# Patient Record
Sex: Male | Born: 1970 | Race: White | Hispanic: No | Marital: Married | Smoking: Current every day smoker
Health system: Southern US, Community
[De-identification: ages and names within clinical notes are randomized; demographics above are authoritative.]

## PROBLEM LIST (undated history)

## (undated) DIAGNOSIS — E079 Disorder of thyroid, unspecified: Secondary | ICD-10-CM

## (undated) DIAGNOSIS — I10 Essential (primary) hypertension: Secondary | ICD-10-CM

---

## 2013-09-27 ENCOUNTER — Inpatient Hospital Stay (HOSPITAL_COMMUNITY)
Admission: EM | Admit: 2013-09-27 | Discharge: 2013-09-30 | DRG: 202 | Disposition: A | Discharge status: Home / Self Care | Payer: PRIVATE HEALTH INSURANCE | Attending: Internal Medicine | Admitting: Internal Medicine

## 2013-09-27 ENCOUNTER — Observation Stay (HOSPITAL_COMMUNITY): Payer: PRIVATE HEALTH INSURANCE

## 2013-09-27 ENCOUNTER — Emergency Department (HOSPITAL_COMMUNITY): Payer: PRIVATE HEALTH INSURANCE

## 2013-09-27 ENCOUNTER — Encounter (HOSPITAL_COMMUNITY): Payer: Self-pay | Admitting: Emergency Medicine

## 2013-09-27 DIAGNOSIS — F17200 Nicotine dependence, unspecified, uncomplicated: Secondary | ICD-10-CM | POA: Diagnosis present

## 2013-09-27 DIAGNOSIS — E785 Hyperlipidemia, unspecified: Secondary | ICD-10-CM

## 2013-09-27 DIAGNOSIS — G4733 Obstructive sleep apnea (adult) (pediatric): Secondary | ICD-10-CM | POA: Diagnosis present

## 2013-09-27 DIAGNOSIS — I509 Heart failure, unspecified: Secondary | ICD-10-CM | POA: Diagnosis present

## 2013-09-27 DIAGNOSIS — R5381 Other malaise: Secondary | ICD-10-CM | POA: Diagnosis present

## 2013-09-27 DIAGNOSIS — R062 Wheezing: Secondary | ICD-10-CM

## 2013-09-27 DIAGNOSIS — Z6841 Body Mass Index (BMI) 40.0 and over, adult: Secondary | ICD-10-CM

## 2013-09-27 DIAGNOSIS — E669 Obesity, unspecified: Secondary | ICD-10-CM | POA: Diagnosis present

## 2013-09-27 DIAGNOSIS — R079 Chest pain, unspecified: Secondary | ICD-10-CM

## 2013-09-27 DIAGNOSIS — E039 Hypothyroidism, unspecified: Secondary | ICD-10-CM

## 2013-09-27 DIAGNOSIS — J209 Acute bronchitis, unspecified: Principal | ICD-10-CM

## 2013-09-27 DIAGNOSIS — I5033 Acute on chronic diastolic (congestive) heart failure: Secondary | ICD-10-CM

## 2013-09-27 DIAGNOSIS — E069 Thyroiditis, unspecified: Secondary | ICD-10-CM | POA: Diagnosis present

## 2013-09-27 DIAGNOSIS — I10 Essential (primary) hypertension: Secondary | ICD-10-CM

## 2013-09-27 HISTORY — DX: Disorder of thyroid, unspecified: E07.9

## 2013-09-27 HISTORY — DX: Essential (primary) hypertension: I10

## 2013-09-27 LAB — CBC
HCT: 43.8 % (ref 39.0–52.0)
HEMATOCRIT: 45 % (ref 39.0–52.0)
HEMOGLOBIN: 16.1 g/dL (ref 13.0–17.0)
Hemoglobin: 15.4 g/dL (ref 13.0–17.0)
MCH: 33.6 pg (ref 26.0–34.0)
MCH: 33.5 pg (ref 26.0–34.0)
MCHC: 35.8 g/dL (ref 30.0–36.0)
MCHC: 35.2 g/dL (ref 30.0–36.0)
MCV: 93.9 fL (ref 78.0–100.0)
MCV: 95.2 fL (ref 78.0–100.0)
PLATELETS: 293 10*3/uL (ref 150–400)
Platelets: 301 10*3/uL (ref 150–400)
RBC: 4.79 MIL/uL (ref 4.22–5.81)
RBC: 4.6 MIL/uL (ref 4.22–5.81)
RDW: 13.3 % (ref 11.5–15.5)
RDW: 13.5 % (ref 11.5–15.5)
WBC: 16.5 10*3/uL — ABNORMAL HIGH (ref 4.0–10.5)
WBC: 14.4 10*3/uL — AB (ref 4.0–10.5)

## 2013-09-27 LAB — BASIC METABOLIC PANEL
BUN: 11 mg/dL (ref 6–23)
CO2: 24 mEq/L (ref 19–32)
Calcium: 9.6 mg/dL (ref 8.4–10.5)
Chloride: 98 mEq/L (ref 96–112)
Creatinine, Ser: 0.99 mg/dL (ref 0.50–1.35)
GFR calc Af Amer: >90 mL/min (ref >90)
GLUCOSE: 198 mg/dL — AB (ref 70–99)
Potassium: 4 mEq/L (ref 3.7–5.3)
Sodium: 139 mEq/L (ref 137–147)

## 2013-09-27 LAB — LIPID PANEL
CHOL/HDL RATIO: 9.2 ratio
CHOLESTEROL: 302 mg/dL — AB (ref 0–200)
HDL: 33 mg/dL — ABNORMAL LOW (ref >39)
LDL Cholesterol: UNDETERMINED mg/dL (ref 0–99)
Triglycerides: 443 mg/dL — ABNORMAL HIGH (ref <150)
VLDL: UNDETERMINED mg/dL (ref 0–40)

## 2013-09-27 LAB — CREATININE, SERUM
Creatinine, Ser: 1.1 mg/dL (ref 0.50–1.35)
GFR calc non Af Amer: 80 mL/min — ABNORMAL LOW (ref >90)

## 2013-09-27 LAB — I-STAT TROPONIN, ED: Troponin i, poc: 0 ng/mL (ref 0.00–0.08)

## 2013-09-27 LAB — TROPONIN I

## 2013-09-27 LAB — PRO B NATRIURETIC PEPTIDE: PRO B NATRI PEPTIDE: 17.4 pg/mL (ref 0–125)

## 2013-09-27 MED ORDER — ACETAMINOPHEN 500 MG PO TABS
1000.0000 mg | ORAL_TABLET | Freq: Once | ORAL | Status: AC
  Administered 2013-09-27: 1000 mg via ORAL
  Filled 2013-09-27: qty 2

## 2013-09-27 MED ORDER — ALBUTEROL SULFATE (2.5 MG/3ML) 0.083% IN NEBU: 2.5000 mg | INHALATION_SOLUTION | RESPIRATORY_TRACT | Status: DC | PRN

## 2013-09-27 MED ORDER — AMLODIPINE BESYLATE 5 MG PO TABS
5.0000 mg | ORAL_TABLET | Freq: Every day | ORAL | Status: DC
  Administered 2013-09-27 – 2013-09-30 (×4): 5 mg via ORAL
  Filled 2013-09-27 (×4): qty 1

## 2013-09-27 MED ORDER — SENNA 8.6 MG PO TABS
1.0000 | ORAL_TABLET | Freq: Two times a day (BID) | ORAL | Status: DC
  Administered 2013-09-27 – 2013-09-29 (×5): 8.6 mg via ORAL
  Filled 2013-09-27 (×7): qty 1

## 2013-09-27 MED ORDER — OXYCODONE HCL 5 MG PO TABS
5.0000 mg | ORAL_TABLET | ORAL | Status: DC | PRN
  Administered 2013-09-28 (×2): 5 mg via ORAL
  Filled 2013-09-27 (×2): qty 1

## 2013-09-27 MED ORDER — LEVOTHYROXINE SODIUM 25 MCG PO TABS
25.0000 ug | ORAL_TABLET | Freq: Every day | ORAL | Status: DC
  Administered 2013-09-28: 25 ug via ORAL
  Filled 2013-09-27 (×2): qty 1

## 2013-09-27 MED ORDER — MORPHINE SULFATE 4 MG/ML IJ SOLN
4.0000 mg | Freq: Once | INTRAMUSCULAR | Status: AC
Start: 2013-09-27 — End: 2013-09-27
  Administered 2013-09-27: 4 mg via INTRAVENOUS
  Filled 2013-09-27: qty 1

## 2013-09-27 MED ORDER — ONDANSETRON HCL 4 MG/2ML IJ SOLN: 4.0000 mg | Freq: Four times a day (QID) | INTRAMUSCULAR | Status: DC | PRN

## 2013-09-27 MED ORDER — SODIUM CHLORIDE 0.9 % IJ SOLN
3.0000 mL | Freq: Two times a day (BID) | INTRAMUSCULAR | Status: DC
  Administered 2013-09-27 – 2013-09-29 (×3): 3 mL via INTRAVENOUS

## 2013-09-27 MED ORDER — ONDANSETRON HCL 4 MG/2ML IJ SOLN
4.0000 mg | Freq: Once | INTRAMUSCULAR | Status: AC
  Administered 2013-09-27: 4 mg via INTRAVENOUS
  Filled 2013-09-27: qty 2

## 2013-09-27 MED ORDER — SODIUM CHLORIDE 0.9 % IV SOLN
INTRAVENOUS | Status: DC
  Administered 2013-09-27 – 2013-09-28 (×2): via INTRAVENOUS

## 2013-09-27 MED ORDER — NICOTINE 21 MG/24HR TD PT24
21.0000 mg | MEDICATED_PATCH | Freq: Every day | TRANSDERMAL | Status: DC
  Administered 2013-09-27 – 2013-09-30 (×4): 21 mg via TRANSDERMAL
  Filled 2013-09-27 (×4): qty 1

## 2013-09-27 MED ORDER — ONDANSETRON HCL 4 MG PO TABS: 4.0000 mg | ORAL_TABLET | Freq: Four times a day (QID) | ORAL | Status: DC | PRN

## 2013-09-27 MED ORDER — SORBITOL 70 % SOLN
30.0000 mL | Freq: Every day | Status: DC | PRN
  Filled 2013-09-27: qty 30

## 2013-09-27 MED ORDER — IOHEXOL 350 MG/ML SOLN
100.0000 mL | Freq: Once | INTRAVENOUS | Status: AC | PRN
  Administered 2013-09-27: 100 mL via INTRAVENOUS

## 2013-09-27 MED ORDER — LABETALOL HCL 5 MG/ML IV SOLN: 10.0000 mg | INTRAVENOUS | Status: DC | PRN

## 2013-09-27 MED ORDER — DOCUSATE SODIUM 100 MG PO CAPS
100.0000 mg | ORAL_CAPSULE | Freq: Two times a day (BID) | ORAL | Status: DC
  Administered 2013-09-27 – 2013-09-29 (×5): 100 mg via ORAL
  Filled 2013-09-27 (×7): qty 1

## 2013-09-27 MED ORDER — HEPARIN SODIUM (PORCINE) 5000 UNIT/ML IJ SOLN
5000.0000 [IU] | Freq: Three times a day (TID) | INTRAMUSCULAR | Status: DC
  Administered 2013-09-27 – 2013-09-30 (×8): 5000 [IU] via SUBCUTANEOUS
  Filled 2013-09-27 (×11): qty 1

## 2013-09-27 MED ORDER — ASPIRIN EC 325 MG PO TBEC
325.0000 mg | DELAYED_RELEASE_TABLET | Freq: Every day | ORAL | Status: DC
  Administered 2013-09-27 – 2013-09-30 (×4): 325 mg via ORAL
  Filled 2013-09-27 (×4): qty 1

## 2013-09-27 NOTE — H&P (Signed)
[facility] History and Physical  Brydon Spahr ZOX:[id number] DOB: 1971/07/03 DOA: 09/27/2013  Referring provider: Irish Elders NP PCP: No PCP Per Patient and patient reports seeing a physician associated with [facility]; does not know the name  Chief Complaint: Chest pain, dizziness, syncope  HPI: Tejay Hubert is a 43 y.o. male. Patient has little known past medical history. 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 He thinks his TSH was 27 although he does not know this test by name. He started levothyroxine 25 mcg. Does have ongoing tobacco abuse one half pack per day. OSA not on positive pressure.  Patient has had upper respiratory symptoms including rhinorrhea, nasal drainage, cough productive of intermittent green sputum. He developed several episodes of sharp pain in the left side of his chest and between his shoulder blades in the back. Episodes last about 15-20 minutes and are not provoked by anything identifiable nor relieved consistently by anything. He estimates that he had 7-8 episodes of pain daily from Saturday through Monday. He does have pain-free intervals between these. Most recently the pain is in the upper back and radiates to certain extent his left arm. It is consistently worsened by deep inspiration so the patient says. Around noon today the patient became dizzy and after a few moments passed out lost consciousness. Was found by his wife who estimated that he was out for 3 or 4 minutes, but she is not sure about the specific duration.   Patient does report fatigue. Denies any fevers or chills. No known sick contacts. Denies bowel or bladder changes. Does report intermittent wheezing which is unusual for him; reports no history of COPD or asthma although he does think he was diagnosed with bronchitis sometime ago. Patient is not aware of any history of cardiovascular disease but his mother  did have coronary disease in her 42s. Patient denies history of hyperlipidemia.   Does report fatigue and weight gain. Did have a period where he felt as if his thyroid gland was tender but this is resolving.  Review of Systems:  Constitutional:  No weight loss, night sweats, Fevers, chills.   + Fatigue HEENT:  No headaches, Difficulty swallowing,Tooth/dental problems,  No , itching, ear ache, ; + sneezing, nasal congestion, post nasal drip,  Cardio-vascular:  No Orthopnea, PND; does have swelling in lower extremities over past several weeks GI:  No heartburn, indigestion, abdominal pain, nausea, vomiting, diarrhea, change in bowel habits, loss of appetite  Resp:  .No coughing up of blood.No change in color of mucus Skin:  no rash or lesions reported GU:  no dysuria, change in color of urine, no urgency or frequency. No flank pain.  Musculoskeletal:  Psych:  No change in mood or affect. No depression or anxiety. No memory loss.  The remainder of a complete review of systems was reviewed and was negative.  Past Medical History  Diagnosis Date  . Thyroid disease    History reviewed. No pertinent past surgical history. Social History:  reports that he has been smoking.  He does not have any smokeless tobacco history on file. He reports that he does not drink alcohol or use illicit drugs.  Allergies  Allergen Reactions  . Sulfa Antibiotics Other (See Comments)    Childhood reaction (throat swelled)    No family history on file.   Prior to Admission medications   Medication Sig Start Date End Date Taking? Authorizing Provider  levothyroxine (SYNTHROID, LEVOTHROID) 25 MCG  tablet Take 25 mcg by mouth daily before breakfast.   Yes Historical Provider, MD    Physical Exam: Filed Vitals:   09/27/13 2009  BP: 136/70  Pulse: 93  Temp: 97.6 F (36.4 C)  Resp: 22    BP 136/70  Pulse 93  Temp(Src) 97.6 F (36.4 C) (Oral)  Resp 22  Ht 6\' 4"  (1.93 m)  Wt 151.91 kg (334 lb  14.4 oz)  BMI 40.78 kg/m2  SpO2 97%  BP 136/70  Pulse 93  Temp(Src) 97.6 F (36.4 C) (Oral)  Resp 22  Ht 6\' 4"  (1.93 m)  Wt 151.91 kg (334 lb 14.4 oz)  BMI 40.78 kg/m2  SpO2 97%  General Appearance:    Alert, cooperative, no distress, appears stated age  Head:    Normocephalic, without obvious abnormality, atraumatic  Eyes:    PERRL, conjunctiva/corneas clear, EOM's intact, fundi    benign, both eyes       Nose:   Nares normal, septum midline, mucosa normal,  clear drainage  Noted but no sinus tenderness  Throat:   Lips, mucosa, and tongue normal; teeth and gums normal  Neck:   Supple, symmetrical, trachea midline, no adenopathy;       Thyroid is symmetrically enlarged without palpable nodules; mobile; nontender  Lungs:     Clear to auscultation bilaterally except for lower pitched end expiratory wheezes in all posterior lung fields, respirations unlabored  Chest wall:    No tenderness or deformity  Heart:    Regular rate and rhythm, S1 and S2 normal, no murmur, rub   or gallop  Abdomen:     Soft, non-tender, bowel sounds active all four quadrants,    no masses, no organomegaly  Extremities:   Extremities normal, atraumatic, no cyanosis or edema  Pulses:   2+ and symmetric all extremities  Skin:   Skin color, texture slightly coarsened, turgor normal, no rashes or lesions  Neurologic:   Good attentiveness and insight into condition            Labs on Admission:  Basic Metabolic Panel:  Recent Labs Lab 09/27/13 1536  NA 139  K 4.0  CL 98  CO2 24  GLUCOSE 198*  BUN 11  CREATININE 0.99  CALCIUM 9.6   Liver Function Tests: No results found for this basename: AST, ALT, ALKPHOS, BILITOT, PROT, ALBUMIN,  in the last 168 hours No results found for this basename: LIPASE, AMYLASE,  in the last 168 hours No results found for this basename: AMMONIA,  in the last 168 hours CBC:  Recent Labs Lab 09/27/13 1536  WBC 16.5*  HGB 16.1  HCT 45.0  MCV 93.9  PLT 301    Cardiac Enzymes: No results found for this basename: CKTOTAL, CKMB, CKMBINDEX, TROPONINI,  in the last 168 hours  BNP (last 3 results) No results found for this basename: PROBNP,  in the last 8760 hours CBG: No results found for this basename: GLUCAP,  in the last 168 hours  Radiological Exams on Admission: Ct Angio Chest Aortic Dissect W &/or W/o  09/27/2013   CLINICAL DATA:  Chest pain. Left-sided neck and abdominal pain. Syncopal episode.  EXAM: CT ANGIOGRAPHY CHEST, ABDOMEN AND PELVIS  TECHNIQUE: Multidetector CT imaging through the chest, abdomen and pelvis was performed using the standard protocol during bolus administration of intravenous contrast. Multiplanar reconstructed images and MIPs were obtained and reviewed to evaluate the vascular anatomy.  CONTRAST:  OMNIPAQUE IOHEXOL 350 MG/ML SOLN  COMPARISON:  DG  CHEST PORTABLE dated 08/27/2013  FINDINGS: CTA CHEST FINDINGS  Lungs/Pleura: Mild motion degradation. Suspect mild centrilobular emphysema. Subpleural lymph node at 3 mm along the right minor fissure on image 56/series 6. A lingular calcified granuloma on image 59/series 6.  No pleural fluid.  Minimal bilateral pleural thickening.  Heart/Mediastinum: No intramural hematoma on unenhanced images. No supraclavicular adenopathy. Mild soft tissue fullness of the thyroid gland, greater on the left. No well-defined focal lesion.  Normal aortic caliber without dissection. No mediastinal adenopathy. Calcified left hilar 1.5 cm node on image 57/series 5. Mildly prominent right hilar and infrahilar nodal tissue which is likely reactive.  Review of the MIP images confirms the above findings.  CTA ABDOMEN AND PELVIS FINDINGS  Abdomen/Pelvis: Mild hepatic steatosis. Normal spleen, stomach, pancreas, gallbladder, biliary tract, adrenal glands, right kidney. Too small to characterize lower pole left renal lesion on image 189/series 5.  Normal caliber of the abdominal aorta, without evidence of  dissection. There are 2 right renal arteries. The inferior mesenteric artery is patent. No retroperitoneal or retrocrural adenopathy.  Normal colon, appendix, and terminal ileum. Normal small bowel without abdominal ascites. A fat containing left inguinal hernia. No pelvic adenopathy. The iliac vasculature is patent, without dissection or significant stenosis. Normal urinary bladder and prostate. No significant free fluid.  Bones/Musculoskeletal: Scattered pelvic bone islands. Remote left rib fractures.  Review of the MIP images confirms the above findings.  IMPRESSION: 1. No evidence above aortic dissection or aneurysm. 2. No other explanation for chest pain. 3. Old granulomatous disease in the left side of the chest. 4. Mild hepatic steatosis.   Electronically Signed   By: Jeronimo Greaves M.D.   On: 09/27/2013 17:33   Ct Cta Abd/pel W/cm &/or W/o Cm  09/27/2013   CLINICAL DATA:  Chest pain. Left-sided neck and abdominal pain. Syncopal episode.  EXAM: CT ANGIOGRAPHY CHEST, ABDOMEN AND PELVIS  TECHNIQUE: Multidetector CT imaging through the chest, abdomen and pelvis was performed using the standard protocol during bolus administration of intravenous contrast. Multiplanar reconstructed images and MIPs were obtained and reviewed to evaluate the vascular anatomy.  CONTRAST:  OMNIPAQUE IOHEXOL 350 MG/ML SOLN  COMPARISON:  DG CHEST PORTABLE dated 08/27/2013  FINDINGS: CTA CHEST FINDINGS  Lungs/Pleura: Mild motion degradation. Suspect mild centrilobular emphysema. Subpleural lymph node at 3 mm along the right minor fissure on image 56/series 6. A lingular calcified granuloma on image 59/series 6.  No pleural fluid.  Minimal bilateral pleural thickening.  Heart/Mediastinum: No intramural hematoma on unenhanced images. No supraclavicular adenopathy. Mild soft tissue fullness of the thyroid gland, greater on the left. No well-defined focal lesion.  Normal aortic caliber without dissection. No mediastinal adenopathy.  Calcified left hilar 1.5 cm node on image 57/series 5. Mildly prominent right hilar and infrahilar nodal tissue which is likely reactive.  Review of the MIP images confirms the above findings.  CTA ABDOMEN AND PELVIS FINDINGS  Abdomen/Pelvis: Mild hepatic steatosis. Normal spleen, stomach, pancreas, gallbladder, biliary tract, adrenal glands, right kidney. Too small to characterize lower pole left renal lesion on image 189/series 5.  Normal caliber of the abdominal aorta, without evidence of dissection. There are 2 right renal arteries. The inferior mesenteric artery is patent. No retroperitoneal or retrocrural adenopathy.  Normal colon, appendix, and terminal ileum. Normal small bowel without abdominal ascites. A fat containing left inguinal hernia. No pelvic adenopathy. The iliac vasculature is patent, without dissection or significant stenosis. Normal urinary bladder and prostate. No significant free fluid.  Bones/Musculoskeletal: Scattered  pelvic bone islands. Remote left rib fractures.  Review of the MIP images confirms the above findings.  IMPRESSION: 1. No evidence above aortic dissection or aneurysm. 2. No other explanation for chest pain. 3. Old granulomatous disease in the left side of the chest. 4. Mild hepatic steatosis.   Electronically Signed   By: Jeronimo GreavesKyle  Talbot M.D.   On: 09/27/2013 17:33    EKG: Independently reviewed. Sinus tachycardia rate 126. Slightly tall T waves in V2 suggesting atrial enlargement. No dynamic ST or T wave abnormalities.  Assessment/Plan Active Problems:   Chest pain  (hospital diagnoses bolded)  1. Sinus congestion, wheezing, leukocytosis. Likely represents lower respiratory viral infection. No sick contacts known.  Obtain respiratory virus panel  Obtain dedicated chest x-ray  IV hydration, adv diet as tol and encourage PO fluifds  Pending results would consider treatment for developing pneumonia or bacterial lower respiratory infection  Patient's chest pain  is very likely secondary to this infection as it is consistently pleuritic  SABA inhaled for likely reactive bronchospasm 2. Pleuritic chest pain, syncope; sinus tachycardia. The possibility of an acute coronary syndrome was considered by emergency medicine providers. There are no dynamic appearing changes on the EKG. Initial troponin is negative.  Aortic dissection excluded by CT scan.  Obtain serial troponin values  Keep n.p.o. past midnight for possible noninvasive stress test in the morning pending results of these tests as described above  If no stress testing is performed would consider noninvasive echo to evaluate cardiac and valvular function  Stratification with lipid profile, hemoglobin A1c  ASA daily 3. Thyroid disease as yet uncharacterized. Patient reports history of hypothyroidism; given that he had thyroid tenderness diagnosis of thyroiditis as possible. It's unclear where in the natural progression of this illness he is currently or if an alternative explanation for thyroidal disease is present.  Obtain TSH, free thyroxine  Pending results could send thyroglobulin to evaluate for active thyroid destruction and thyroiditis  Continue levothyroxine 25 mcg for now although this dose may need to change in coordination with primary outpatient physicians will manage patient moving forward 4. Hypertension to systolic blood pressure in 170 range. Likely worsened by acute illness but given the patient's reported history of hypertension in the outpatient setting I think treatment is indicated. Will initiate amlodipine to be continued on discharge. 5. Chronic obstructive sleep apnea not an active hospital diagnosis but patient will likely require polysomnography as an outpatient in treatment with noninvasive positive pressure ventilation  Code Status: Full code  Family Communication: Wife present at bedside provides additional history as well  Disposition Plan: Observation cardiac    Time spent: 50 mins  Maxie BarbMILKS, Anival [place]asha, [facility] Pager [phone]

## 2013-09-27 NOTE — ED Notes (Signed)
Pt reports cp x 3 days intermittently. Today pt reported CP to his wife around 12 pm today, then had a syncopal episode that lasted for about 3 mins. When pt came around, reports he was SOB. Now stating left sided cp, radiating to left side of neck. States he has a bad thyroid gland on his left side.

## 2013-09-27 NOTE — ED Notes (Signed)
Attempted report 

## 2013-09-27 NOTE — ED Provider Notes (Signed)
CSN: [id number]     Arrival date & time 09/27/13  1500 History   First MD Initiated Contact with Patient 09/27/13 1546     Chief Complaint  Patient presents with  . Loss of Consciousness  . Chest Pain     (Consider location/radiation/quality/duration/timing/severity/associated sxs/prior Treatment) Patient is a 43 y.o. male presenting with chest pain. The history is provided by the patient. No language interpreter was used.  Chest Pain Pain location:  Substernal area and L chest Pain quality: pressure   Pain severity:  Moderate Duration:  3 days Timing:  Intermittent Context: not breathing   Associated symptoms: back pain and shortness of breath   Associated symptoms: no abdominal pain, no fever, not vomiting and no weakness   Risk factors: smoking   Risk factors: no prior DVT/PE and no surgery    Pt is a 43 year old male who presents with c/o chest pain for approx the last 3-4 days on/off. He reports that his chest pain returned today and then he had a syncopal episode for approx 2-3 minutes. He also reports that he has had associated shortness of breath. He describes his chest pain as pressure in the left side of his chest that radiates into his neck. He reports that this pain started in the middle of his back. He denies N/V, diaphoresis, cough, wheezing or recent illness.  Past Medical History  Diagnosis Date  . Thyroid disease   . Essential hypertension, benign 09/28/2013   History reviewed. No pertinent past surgical history. No family history on file. History  Substance Use Topics  . Smoking status: Current Every Day Smoker  . Smokeless tobacco: Not on file  . Alcohol Use: No    Review of Systems  Constitutional: Negative for fever.  Respiratory: Positive for shortness of breath. Negative for wheezing and stridor.   Cardiovascular: Positive for chest pain.  Gastrointestinal: Negative for vomiting and abdominal pain.  Musculoskeletal: Positive for back pain.   Neurological: Negative for weakness.  All other systems reviewed and are negative.      Allergies  Sulfa antibiotics  Home Medications  No current outpatient prescriptions on file. BP 148/78  Pulse 112  Temp(Src) 97.4 F (36.3 C) (Oral)  Resp 18  Ht 6\' 4"  (1.93 m)  Wt 332 lb 13.9 oz (150.987 kg)  BMI 40.53 kg/m2  SpO2 98% Physical Exam  Nursing note and vitals reviewed. Constitutional: He is oriented to person, place, and time. He appears well-developed and well-nourished. No distress.  HENT:  Head: Normocephalic and atraumatic.  Eyes: Conjunctivae and EOM are normal.  Neck: Normal range of motion. Neck supple.  Cardiovascular: Normal rate, regular rhythm and normal heart sounds.   Pulmonary/Chest: Effort normal and breath sounds normal. No respiratory distress. He has no wheezes.  Abdominal: Soft. Bowel sounds are normal. He exhibits no distension. There is no tenderness.  Musculoskeletal: Normal range of motion.  Neurological: He is alert and oriented to person, place, and time. No cranial nerve deficit. Coordination normal.  Skin: Skin is warm and dry. No rash noted.  Psychiatric: He has a normal mood and affect. His behavior is normal. Judgment and thought content normal.    ED Course  Procedures (including critical care time) Labs Review Labs Reviewed  CBC - Abnormal; Notable for the following:    WBC 16.5 (*)    All other components within normal limits  BASIC METABOLIC PANEL - Abnormal; Notable for the following:    Glucose, Bld 198 (*)  All other components within normal limits  CBC - Abnormal; Notable for the following:    WBC 14.4 (*)    All other components within normal limits  CREATININE, SERUM - Abnormal; Notable for the following:    GFR calc non Af Amer 80 (*)    All other components within normal limits  BASIC METABOLIC PANEL - Abnormal; Notable for the following:    Glucose, Bld 187 (*)    All other components within normal limits  CBC -  Abnormal; Notable for the following:    WBC 11.7 (*)    All other components within normal limits  TSH - Abnormal; Notable for the following:    TSH 16.285 (*)    All other components within normal limits  LIPID PANEL - Abnormal; Notable for the following:    Cholesterol 302 (*)    Triglycerides 443 (*)    HDL 33 (*)    All other components within normal limits  HEMOGLOBIN A1C - Abnormal; Notable for the following:    Hemoglobin A1C 6.1 (*)    Mean Plasma Glucose 128 (*)    All other components within normal limits  C-REACTIVE PROTEIN - Abnormal; Notable for the following:    CRP <0.5 (*)    All other components within normal limits  T3 - Abnormal; Notable for the following:    T3, Total 75.2 (*)    All other components within normal limits  CBC - Abnormal; Notable for the following:    WBC 12.0 (*)    All other components within normal limits  BASIC METABOLIC PANEL - Abnormal; Notable for the following:    Sodium 136 (*)    Chloride 94 (*)    Glucose, Bld 235 (*)    All other components within normal limits  RESPIRATORY VIRUS PANEL  RAPID STREP SCREEN  CULTURE, GROUP A STREP  PRO B NATRIURETIC PEPTIDE  T4, FREE  TROPONIN I  TROPONIN I  TROPONIN I  INFLUENZA PANEL BY PCR (TYPE A & B, H1N1)  SEDIMENTATION RATE  THYROGLOBULIN ANTIBODY  THYROID PEROXIDASE ANTIBODY  I-STAT TROPOININ, ED   Imaging Review X-ray Chest Pa And Lateral   09/27/2013   CLINICAL DATA:  Cough, fever and shortness of breath.  EXAM: CHEST  2 VIEW  COMPARISON:  08/27/2013  FINDINGS: The cardiomediastinal silhouette is unremarkable.  Mild peribronchial thickening again noted.  There is no evidence of focal airspace disease, pulmonary edema, suspicious pulmonary nodule/mass, pleural effusion, or pneumothorax. No acute bony abnormalities are identified.  Remote left rib fractures are again noted.  IMPRESSION: No evidence of acute cardiopulmonary disease.   Electronically Signed   By: Laveda Abbe M.D.   On:  09/27/2013 23:53   US Soft Tissue Head/neck  09/28/2013   CLINICAL DATA:  Thyroid disease.  EXAM: THYROID ULTRASOUND  TECHNIQUE: Ultrasound examination of the thyroid gland and adjacent soft tissues was performed.  COMPARISON:  CT ANGIO CHEST AORTA W/CM & WO/CM dated 09/27/2013  FINDINGS: Right thyroid lobe  Measurements: 5.6 x 3.4 x 2.0 cm. No nodules visualized. Heterogeneous echotexture.  Left thyroid lobe  Measurements: By 0.9 x 3.4 x 2.5 cm. No nodules visualized. Heterogeneous echotexture.  Isthmus  Thickness: 0.8 cm.  No nodules visualized.  Lymph nodes  1.4 x 0.4 x 1.1 cm right cervical lymph node.  IMPRESSION: Thyroid gland is prominent with heterogeneous echotexture noted diffusely. No focal mass lesion. These findings suggest thyroiditis.   Electronically Signed   By: Maisie Fus  Register  On: 09/28/2013 14:32   Ct Angio Chest Aortic Dissect W &/or W/o  09/27/2013   CLINICAL DATA:  Chest pain. Left-sided neck and abdominal pain. Syncopal episode.  EXAM: CT ANGIOGRAPHY CHEST, ABDOMEN AND PELVIS  TECHNIQUE: Multidetector CT imaging through the chest, abdomen and pelvis was performed using the standard protocol during bolus administration of intravenous contrast. Multiplanar reconstructed images and MIPs were obtained and reviewed to evaluate the vascular anatomy.  CONTRAST:  OMNIPAQUE IOHEXOL 350 MG/ML SOLN  COMPARISON:  DG CHEST PORTABLE dated 08/27/2013  FINDINGS: CTA CHEST FINDINGS  Lungs/Pleura: Mild motion degradation. Suspect mild centrilobular emphysema. Subpleural lymph node at 3 mm along the right minor fissure on image 56/series 6. A lingular calcified granuloma on image 59/series 6.  No pleural fluid.  Minimal bilateral pleural thickening.  Heart/Mediastinum: No intramural hematoma on unenhanced images. No supraclavicular adenopathy. Mild soft tissue fullness of the thyroid gland, greater on the left. No well-defined focal lesion.  Normal aortic caliber without dissection. No mediastinal  adenopathy. Calcified left hilar 1.5 cm node on image 57/series 5. Mildly prominent right hilar and infrahilar nodal tissue which is likely reactive.  Review of the MIP images confirms the above findings.  CTA ABDOMEN AND PELVIS FINDINGS  Abdomen/Pelvis: Mild hepatic steatosis. Normal spleen, stomach, pancreas, gallbladder, biliary tract, adrenal glands, right kidney. Too small to characterize lower pole left renal lesion on image 189/series 5.  Normal caliber of the abdominal aorta, without evidence of dissection. There are 2 right renal arteries. The inferior mesenteric artery is patent. No retroperitoneal or retrocrural adenopathy.  Normal colon, appendix, and terminal ileum. Normal small bowel without abdominal ascites. A fat containing left inguinal hernia. No pelvic adenopathy. The iliac vasculature is patent, without dissection or significant stenosis. Normal urinary bladder and prostate. No significant free fluid.  Bones/Musculoskeletal: Scattered pelvic bone islands. Remote left rib fractures.  Review of the MIP images confirms the above findings.  IMPRESSION: 1. No evidence above aortic dissection or aneurysm. 2. No other explanation for chest pain. 3. Old granulomatous disease in the left side of the chest. 4. Mild hepatic steatosis.   Electronically Signed   By: Jeronimo Greaves M.D.   On: 09/27/2013 17:33   Ct Cta Abd/pel W/cm &/or W/o Cm  09/27/2013   CLINICAL DATA:  Chest pain. Left-sided neck and abdominal pain. Syncopal episode.  EXAM: CT ANGIOGRAPHY CHEST, ABDOMEN AND PELVIS  TECHNIQUE: Multidetector CT imaging through the chest, abdomen and pelvis was performed using the standard protocol during bolus administration of intravenous contrast. Multiplanar reconstructed images and MIPs were obtained and reviewed to evaluate the vascular anatomy.  CONTRAST:  OMNIPAQUE IOHEXOL 350 MG/ML SOLN  COMPARISON:  DG CHEST PORTABLE dated 08/27/2013  FINDINGS: CTA CHEST FINDINGS  Lungs/Pleura: Mild motion  degradation. Suspect mild centrilobular emphysema. Subpleural lymph node at 3 mm along the right minor fissure on image 56/series 6. A lingular calcified granuloma on image 59/series 6.  No pleural fluid.  Minimal bilateral pleural thickening.  Heart/Mediastinum: No intramural hematoma on unenhanced images. No supraclavicular adenopathy. Mild soft tissue fullness of the thyroid gland, greater on the left. No well-defined focal lesion.  Normal aortic caliber without dissection. No mediastinal adenopathy. Calcified left hilar 1.5 cm node on image 57/series 5. Mildly prominent right hilar and infrahilar nodal tissue which is likely reactive.  Review of the MIP images confirms the above findings.  CTA ABDOMEN AND PELVIS FINDINGS  Abdomen/Pelvis: Mild hepatic steatosis. Normal spleen, stomach, pancreas, gallbladder, biliary tract, adrenal glands,  right kidney. Too small to characterize lower pole left renal lesion on image 189/series 5.  Normal caliber of the abdominal aorta, without evidence of dissection. There are 2 right renal arteries. The inferior mesenteric artery is patent. No retroperitoneal or retrocrural adenopathy.  Normal colon, appendix, and terminal ileum. Normal small bowel without abdominal ascites. A fat containing left inguinal hernia. No pelvic adenopathy. The iliac vasculature is patent, without dissection or significant stenosis. Normal urinary bladder and prostate. No significant free fluid.  Bones/Musculoskeletal: Scattered pelvic bone islands. Remote left rib fractures.  Review of the MIP images confirms the above findings.  IMPRESSION: 1. No evidence above aortic dissection or aneurysm. 2. No other explanation for chest pain. 3. Old granulomatous disease in the left side of the chest. 4. Mild hepatic steatosis.   Electronically Signed   By: Jeronimo GreavesKyle  Talbot M.D.   On: 09/27/2013 17:33      MDM   Final diagnoses:  Chest pain  Acute on chronic diastolic CHF (congestive heart failure), NYHA  class 2  Wheezing  Hyperlipidemia  Acute bronchitis  Essential hypertension, benign    Chest pain, shortness of breath with history of syncopal episode. Back pain initially, CT angio of chest, abdomen and pelvis negative for aortic dissection. First troponin negative. Feeling better after Morphine and zofran here in ER. Pt with tachycardia and DOE. Called hospitalist Dr. Jerilynn BirkenheadMilks and will admit for R/O MI and further work-up.     Irish EldersKelly Batina Dougan, NP 09/29/13 1334

## 2013-09-27 NOTE — ED Notes (Signed)
Kelly, NP at the bedside.  

## 2013-09-28 ENCOUNTER — Observation Stay (HOSPITAL_COMMUNITY): Payer: PRIVATE HEALTH INSURANCE

## 2013-09-28 ENCOUNTER — Encounter (HOSPITAL_COMMUNITY): Payer: Self-pay | Admitting: Internal Medicine

## 2013-09-28 DIAGNOSIS — J209 Acute bronchitis, unspecified: Principal | ICD-10-CM | POA: Diagnosis present

## 2013-09-28 DIAGNOSIS — R079 Chest pain, unspecified: Secondary | ICD-10-CM

## 2013-09-28 DIAGNOSIS — I10 Essential (primary) hypertension: Secondary | ICD-10-CM

## 2013-09-28 DIAGNOSIS — E039 Hypothyroidism, unspecified: Secondary | ICD-10-CM | POA: Diagnosis present

## 2013-09-28 DIAGNOSIS — I5033 Acute on chronic diastolic (congestive) heart failure: Secondary | ICD-10-CM

## 2013-09-28 DIAGNOSIS — R062 Wheezing: Secondary | ICD-10-CM

## 2013-09-28 DIAGNOSIS — I509 Heart failure, unspecified: Secondary | ICD-10-CM

## 2013-09-28 DIAGNOSIS — E785 Hyperlipidemia, unspecified: Secondary | ICD-10-CM | POA: Diagnosis present

## 2013-09-28 HISTORY — DX: Essential (primary) hypertension: I10

## 2013-09-28 LAB — BASIC METABOLIC PANEL
BUN: 18 mg/dL (ref 6–23)
CHLORIDE: 101 meq/L (ref 96–112)
CO2: 23 mEq/L (ref 19–32)
CREATININE: 0.99 mg/dL (ref 0.50–1.35)
Calcium: 9.1 mg/dL (ref 8.4–10.5)
Glucose, Bld: 187 mg/dL — ABNORMAL HIGH (ref 70–99)
Potassium: 3.9 mEq/L (ref 3.7–5.3)
Sodium: 139 mEq/L (ref 137–147)

## 2013-09-28 LAB — RESPIRATORY VIRUS PANEL
Adenovirus: NOT DETECTED
INFLUENZA A H1: NOT DETECTED
INFLUENZA A H3: NOT DETECTED
INFLUENZA A: NOT DETECTED
Influenza B: NOT DETECTED
Metapneumovirus: NOT DETECTED
Parainfluenza 1: NOT DETECTED
Parainfluenza 2: NOT DETECTED
Parainfluenza 3: NOT DETECTED
RESPIRATORY SYNCYTIAL VIRUS A: NOT DETECTED
RESPIRATORY SYNCYTIAL VIRUS B: NOT DETECTED
Rhinovirus: NOT DETECTED

## 2013-09-28 LAB — INFLUENZA PANEL BY PCR (TYPE A & B)
H1N1FLUPCR: NOT DETECTED
INFLAPCR: NEGATIVE
INFLBPCR: NEGATIVE

## 2013-09-28 LAB — CBC
HCT: 41.5 % (ref 39.0–52.0)
Hemoglobin: 14.3 g/dL (ref 13.0–17.0)
MCH: 33.3 pg (ref 26.0–34.0)
MCHC: 34.5 g/dL (ref 30.0–36.0)
MCV: 96.5 fL (ref 78.0–100.0)
PLATELETS: 262 10*3/uL (ref 150–400)
RBC: 4.3 MIL/uL (ref 4.22–5.81)
RDW: 13.8 % (ref 11.5–15.5)
WBC: 11.7 10*3/uL — AB (ref 4.0–10.5)

## 2013-09-28 LAB — TROPONIN I: Troponin I: <0.3 ng/mL (ref <0.30)

## 2013-09-28 LAB — SEDIMENTATION RATE: Sed Rate: 10 mm/hr (ref 0–16)

## 2013-09-28 LAB — HEMOGLOBIN A1C
Hgb A1c MFr Bld: 6.1 % — ABNORMAL HIGH (ref <5.7)
MEAN PLASMA GLUCOSE: 128 mg/dL — AB (ref <117)

## 2013-09-28 LAB — T3: T3, Total: 75.2 ng/dl — ABNORMAL LOW (ref 80.0–204.0)

## 2013-09-28 LAB — TSH: TSH: 16.285 u[IU]/mL — AB (ref 0.350–4.500)

## 2013-09-28 LAB — RAPID STREP SCREEN (MED CTR MEBANE ONLY): Streptococcus, Group A Screen (Direct): NEGATIVE

## 2013-09-28 LAB — C-REACTIVE PROTEIN: CRP: <0.5 mg/dL — ABNORMAL LOW (ref <0.60)

## 2013-09-28 LAB — T4, FREE: FREE T4: 0.8 ng/dL (ref 0.80–1.80)

## 2013-09-28 MED ORDER — IPRATROPIUM-ALBUTEROL 0.5-2.5 (3) MG/3ML IN SOLN
3.0000 mL | RESPIRATORY_TRACT | Status: DC
  Administered 2013-09-28 – 2013-09-29 (×3): 3 mL via RESPIRATORY_TRACT
  Filled 2013-09-28 (×3): qty 3

## 2013-09-28 MED ORDER — ATORVASTATIN CALCIUM 20 MG PO TABS
20.0000 mg | ORAL_TABLET | Freq: Every day | ORAL | Status: DC
  Administered 2013-09-28 – 2013-09-29 (×2): 20 mg via ORAL
  Filled 2013-09-28 (×3): qty 1

## 2013-09-28 MED ORDER — BENZONATATE 100 MG PO CAPS
100.0000 mg | ORAL_CAPSULE | Freq: Three times a day (TID) | ORAL | Status: DC
  Administered 2013-09-28 – 2013-09-30 (×6): 100 mg via ORAL
  Filled 2013-09-28 (×8): qty 1

## 2013-09-28 MED ORDER — BUDESONIDE-FORMOTEROL FUMARATE 80-4.5 MCG/ACT IN AERO
2.0000 | INHALATION_SPRAY | Freq: Two times a day (BID) | RESPIRATORY_TRACT | Status: DC
  Administered 2013-09-28 – 2013-09-30 (×3): 2 via RESPIRATORY_TRACT
  Filled 2013-09-28: qty 6.9

## 2013-09-28 MED ORDER — LEVOTHYROXINE SODIUM 50 MCG PO TABS
50.0000 ug | ORAL_TABLET | Freq: Every day | ORAL | Status: DC
  Administered 2013-09-29 – 2013-09-30 (×2): 50 ug via ORAL
  Filled 2013-09-28 (×3): qty 1

## 2013-09-28 MED ORDER — FUROSEMIDE 40 MG PO TABS
40.0000 mg | ORAL_TABLET | Freq: Every day | ORAL | Status: DC
  Administered 2013-09-28 – 2013-09-29 (×2): 40 mg via ORAL
  Filled 2013-09-28 (×3): qty 1

## 2013-09-28 MED ORDER — ALBUTEROL SULFATE (2.5 MG/3ML) 0.083% IN NEBU
2.5000 mg | INHALATION_SOLUTION | Freq: Four times a day (QID) | RESPIRATORY_TRACT | Status: DC
  Administered 2013-09-28 (×2): 2.5 mg via RESPIRATORY_TRACT
  Filled 2013-09-28 (×2): qty 3

## 2013-09-28 MED ORDER — DEXTROSE 5 % IV SOLN
500.0000 mg | INTRAVENOUS | Status: DC
  Administered 2013-09-28 – 2013-09-29 (×2): 500 mg via INTRAVENOUS
  Filled 2013-09-28 (×3): qty 500

## 2013-09-28 MED ORDER — METHYLPREDNISOLONE SODIUM SUCC 125 MG IJ SOLR
60.0000 mg | Freq: Four times a day (QID) | INTRAMUSCULAR | Status: DC
  Administered 2013-09-28 – 2013-09-29 (×4): 60 mg via INTRAVENOUS
  Filled 2013-09-28 (×8): qty 0.96

## 2013-09-28 NOTE — Progress Notes (Signed)
Patient ID: Darryl Green  male  MLJ:[id number]    DOB: [birth year]    DOA: 09/27/2013  PCP: No PCP Per Patient  Assessment/Plan: Principal Problem:   Acute bronchitis - Paced on scheduled nebs q4hours, Symbicort, Zithromax, flutter valve, Solu-Medrol IV - Influenza panel negative, check strep throat, RSV panel pending  Active Problems:   Chest pain: Likely pleuritic, anyways would not be able to tolerate stress test at this time due to acute bronchitis - Ruled out for acute ACS, - Will plan on outpatient stress test, check 2-D echocardiogram - Continue aspirin, treat risk factors    Other and unspecified hyperlipidemia - Lipid panel showed cholesterol 302 triglycerides 443 - Started on statins, counseled on diet and weight control    Unspecified hypothyroidism - Patient did have tenderness on the left side of the neck, thyroid ultrasound done, suggestive of possibly thyroiditis - Will check ESR, CRP, thyroglobulin antibodies, thyroid peroxidase antibody, T3 - TSH 16.285, free T4 0.8, discussed in detail with Dr. Elayne Snare, endocrinology, recommended increasing Synthroid to 50 MCG daily. I made an appointment with Dr. Dwyane Dee on 10/17/13 on [facility] for follow up    Morbid obesity - Patient strongly counseled on diet and weight control    Essential hypertension, benign Currently stable, continue Norvasc   DVT Prophylaxis:Heparin subcutaneous   Code Status:  Family Communication:Discussed with patient and his wife at the bedside   Disposition:Remain inpatient   Consultants:  Cardiology   Procedures:  Thyroid ultrasound    CT A chest and abd Negative for PE or aortic dissection   Antibiotics:  AZithromax    Subjective: Seen and examined, significant wheezing and short of breath, no fevers or chills   Objective: Weight change:   Intake/Output Summary (Last 24 hours) at 09/28/13 1549 Last data filed at 09/28/13 1300  Gross per 24 hour  Intake 1421.5 ml  Output     400 ml  Net 1021.5 ml   Blood pressure 125/80, pulse 77, temperature 98.1 F (36.7 C), temperature source Oral, resp. rate 18, height 6' 4"  (1.93 m), weight 151.91 kg (334 lb 14.4 oz), SpO2 99.00%.  Physical Exam: General: Alert and awake, oriented x3, not in any acute distress. HEENT: anicteric sclera, PERLA, EOMI CVS: S1-S2 clear, no murmur rubs or gallops Chest:  diffuse expiratory wheezing bilaterally  Abdomen:  obese soft nontender, nondistended, normal bowel sounds  Extremities: no cyanosis, clubbing or 1+ edema noted bilaterally Neuro: Cranial nerves II-XII intact, no focal neurological deficits  Lab Results: Basic Metabolic Panel:  Recent Labs Lab 09/27/13 1536 09/27/13 2147 09/28/13 0336  NA 139  --  139  K 4.0  --  3.9  CL 98  --  101  CO2 24  --  23  GLUCOSE 198*  --  187*  BUN 11  --  18  CREATININE 0.99 1.10 0.99  CALCIUM 9.6  --  9.1   Liver Function Tests: No results found for this basename: AST, ALT, ALKPHOS, BILITOT, PROT, ALBUMIN,  in the last 168 hours No results found for this basename: LIPASE, AMYLASE,  in the last 168 hours No results found for this basename: AMMONIA,  in the last 168 hours CBC:  Recent Labs Lab 09/27/13 2147 09/28/13 0336  WBC 14.4* 11.7*  HGB 15.4 14.3  HCT 43.8 41.5  MCV 95.2 96.5  PLT 293 262   Cardiac Enzymes:  Recent Labs Lab 09/27/13 2245 09/28/13 0336 09/28/13 0920  TROPONINI <0.30 <0.30 <0.30   BNP: No  components found with this basename: POCBNP,  CBG: No results found for this basename: GLUCAP,  in the last 168 hours   Micro Results: No results found for this or any previous visit (from the past 240 hour(s)).  Studies/Results: X-ray Chest Pa And Lateral   09/27/2013   CLINICAL DATA:  Cough, fever and shortness of breath.  EXAM: CHEST  2 VIEW  COMPARISON:  08/27/2013  FINDINGS: The cardiomediastinal silhouette is unremarkable.  Mild peribronchial thickening again noted.  There is no evidence of focal  airspace disease, pulmonary edema, suspicious pulmonary nodule/mass, pleural effusion, or pneumothorax. No acute bony abnormalities are identified.  Remote left rib fractures are again noted.  IMPRESSION: No evidence of acute cardiopulmonary disease.   Electronically Signed   By: Hassan Rowan M.D.   On: 09/27/2013 23:53   US Soft Tissue Head/neck  09/28/2013   CLINICAL DATA:  Thyroid disease.  EXAM: THYROID ULTRASOUND  TECHNIQUE: Ultrasound examination of the thyroid gland and adjacent soft tissues was performed.  COMPARISON:  CT ANGIO CHEST AORTA W/CM & WO/CM dated 09/27/2013  FINDINGS: Right thyroid lobe  Measurements: 5.6 x 3.4 x 2.0 cm. No nodules visualized. Heterogeneous echotexture.  Left thyroid lobe  Measurements: By 0.9 x 3.4 x 2.5 cm. No nodules visualized. Heterogeneous echotexture.  Isthmus  Thickness: 0.8 cm.  No nodules visualized.  Lymph nodes  1.4 x 0.4 x 1.1 cm right cervical lymph node.  IMPRESSION: Thyroid gland is prominent with heterogeneous echotexture noted diffusely. No focal mass lesion. These findings suggest thyroiditis.   Electronically Signed   By: Marcello Moores  Register   On: 09/28/2013 14:32   Ct Angio Chest Aortic Dissect W &/or W/o  09/27/2013   CLINICAL DATA:  Chest pain. Left-sided neck and abdominal pain. Syncopal episode.  EXAM: CT ANGIOGRAPHY CHEST, ABDOMEN AND PELVIS  TECHNIQUE: Multidetector CT imaging through the chest, abdomen and pelvis was performed using the standard protocol during bolus administration of intravenous contrast. Multiplanar reconstructed images and MIPs were obtained and reviewed to evaluate the vascular anatomy.  CONTRAST:  16m OMNIPAQUE IOHEXOL 350 MG/ML SOLN  COMPARISON:  DG CHEST PORTABLE dated 08/27/2013  FINDINGS: CTA CHEST FINDINGS  Lungs/Pleura: Mild motion degradation. Suspect mild centrilobular emphysema. Subpleural lymph node at 3 mm along the right minor fissure on image 56/series 6. A lingular calcified granuloma on image 59/series 6.  No pleural  fluid.  Minimal bilateral pleural thickening.  Heart/Mediastinum: No intramural hematoma on unenhanced images. No supraclavicular adenopathy. Mild soft tissue fullness of the thyroid gland, greater on the left. No well-defined focal lesion.  Normal aortic caliber without dissection. No mediastinal adenopathy. Calcified left hilar 1.5 cm node on image 57/series 5. Mildly prominent right hilar and infrahilar nodal tissue which is likely reactive.  Review of the MIP images confirms the above findings.  CTA ABDOMEN AND PELVIS FINDINGS  Abdomen/Pelvis: Mild hepatic steatosis. Normal spleen, stomach, pancreas, gallbladder, biliary tract, adrenal glands, right kidney. Too small to characterize lower pole left renal lesion on image 189/series 5.  Normal caliber of the abdominal aorta, without evidence of dissection. There are 2 right renal arteries. The inferior mesenteric artery is patent. No retroperitoneal or retrocrural adenopathy.  Normal colon, appendix, and terminal ileum. Normal small bowel without abdominal ascites. A fat containing left inguinal hernia. No pelvic adenopathy. The iliac vasculature is patent, without dissection or significant stenosis. Normal urinary bladder and prostate. No significant free fluid.  Bones/Musculoskeletal: Scattered pelvic bone islands. Remote left rib fractures.  Review of the  MIP images confirms the above findings.  IMPRESSION: 1. No evidence above aortic dissection or aneurysm. 2. No other explanation for chest pain. 3. Old granulomatous disease in the left side of the chest. 4. Mild hepatic steatosis.   Electronically Signed   By: Abigail Miyamoto M.D.   On: 09/27/2013 17:33   Ct Cta Abd/pel W/cm &/or W/o Cm  09/27/2013   CLINICAL DATA:  Chest pain. Left-sided neck and abdominal pain. Syncopal episode.  EXAM: CT ANGIOGRAPHY CHEST, ABDOMEN AND PELVIS  TECHNIQUE: Multidetector CT imaging through the chest, abdomen and pelvis was performed using the standard protocol during bolus  administration of intravenous contrast. Multiplanar reconstructed images and MIPs were obtained and reviewed to evaluate the vascular anatomy.  CONTRAST:  180m OMNIPAQUE IOHEXOL 350 MG/ML SOLN  COMPARISON:  DG CHEST PORTABLE dated 08/27/2013  FINDINGS: CTA CHEST FINDINGS  Lungs/Pleura: Mild motion degradation. Suspect mild centrilobular emphysema. Subpleural lymph node at 3 mm along the right minor fissure on image 56/series 6. A lingular calcified granuloma on image 59/series 6.  No pleural fluid.  Minimal bilateral pleural thickening.  Heart/Mediastinum: No intramural hematoma on unenhanced images. No supraclavicular adenopathy. Mild soft tissue fullness of the thyroid gland, greater on the left. No well-defined focal lesion.  Normal aortic caliber without dissection. No mediastinal adenopathy. Calcified left hilar 1.5 cm node on image 57/series 5. Mildly prominent right hilar and infrahilar nodal tissue which is likely reactive.  Review of the MIP images confirms the above findings.  CTA ABDOMEN AND PELVIS FINDINGS  Abdomen/Pelvis: Mild hepatic steatosis. Normal spleen, stomach, pancreas, gallbladder, biliary tract, adrenal glands, right kidney. Too small to characterize lower pole left renal lesion on image 189/series 5.  Normal caliber of the abdominal aorta, without evidence of dissection. There are 2 right renal arteries. The inferior mesenteric artery is patent. No retroperitoneal or retrocrural adenopathy.  Normal colon, appendix, and terminal ileum. Normal small bowel without abdominal ascites. A fat containing left inguinal hernia. No pelvic adenopathy. The iliac vasculature is patent, without dissection or significant stenosis. Normal urinary bladder and prostate. No significant free fluid.  Bones/Musculoskeletal: Scattered pelvic bone islands. Remote left rib fractures.  Review of the MIP images confirms the above findings.  IMPRESSION: 1. No evidence above aortic dissection or aneurysm. 2. No other  explanation for chest pain. 3. Old granulomatous disease in the left side of the chest. 4. Mild hepatic steatosis.   Electronically Signed   By: KAbigail MiyamotoM.D.   On: 09/27/2013 17:33    Medications: Scheduled Meds: . amLODipine  5 mg Oral Daily  . aspirin EC  325 mg Oral Daily  . atorvastatin  20 mg Oral q1800  . azithromycin  500 mg Intravenous Q24H  . benzonatate  100 mg Oral TID  . budesonide-formoterol  2 puff Inhalation BID  . docusate sodium  100 mg Oral BID  . heparin  5,000 Units Subcutaneous 3 times per day  . ipratropium-albuterol  3 mL Nebulization Q4H  . [START ON 09/29/2013] levothyroxine  50 mcg Oral QAC breakfast  . methylPREDNISolone (SOLU-MEDROL) injection  60 mg Intravenous Q6H  . nicotine  21 mg Transdermal Daily  . senna  1 tablet Oral BID  . sodium chloride  3 mL Intravenous Q12H      LOS: 1 day   RAI,RIPUDEEP M.D. [facility] 09/28/2013, 3:49 PM Pager: [phone] If 7PM-7AM, please contact night-coverage [email or web address] Password [email or web address]

## 2013-09-28 NOTE — Progress Notes (Signed)
UR completed 

## 2013-09-28 NOTE — Consult Note (Signed)
CONSULT NOTE  Date: 09/28/2013               Patient Name:  Darryl Green MRN: [id number]  DOB: [birth year] Age / Sex: 43 y.o., male        PCP: No PCP Per Patient Primary Cardiologist: New / Preslie Depasquale            Referring Physician: Rod Can, MD              Reason for Consult: Chest pain, dyspnea, leg swelling           History of Present Illness: Patient is a 43 y.o. male with a PMHx of obesity , who was admitted to [facility] on 09/27/2013 for evaluation of  Cough, wheezing, dyspnea.  He has a long hx of obesity and was recently diagnosed with hypothyroidism. He just started on Synthroid.    He reports having shortness breath with exertion as well as leg edema that started several months ago. His legs would swell and become blue in the afternoons and evenings if he sat at his desk for any length of time. 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 This dyspnea with exertion and leg edema has been gradually worsening. This past weekend he developed a cough productive of yellow-green sputum. He developed some intrascapular pain which moved around to the service chest. There was some pleuritic component to the chest pain.  He was admitted for further evaluation.  His white blood cell count was elevated. He was hypertensive. His troponin levels are negative.  His EKG showed sinus tachycardia 126 but no ST or T wave changes.   Medications: Outpatient medications: Prescriptions prior to admission  Medication Sig Dispense Refill  . levothyroxine (SYNTHROID, LEVOTHROID) 25 MCG tablet Take 25 mcg by mouth daily before breakfast.        Current medications: Current Facility-Administered Medications  Medication Dose Route Frequency Provider Last Rate Last Dose  . 0.9 %  sodium chloride infusion   Intravenous Continuous Maxie Barb, MD 75 mL/hr at 09/27/13 2101    . albuterol (PROVENTIL) (2.5 MG/3ML) 0.083% nebulizer  solution 2.5 mg  2.5 mg Nebulization Q2H PRN Maxie Barb, MD      . amLODipine (NORVASC) tablet 5 mg  5 mg Oral Daily Maxie Barb, MD   5 mg at 09/27/13 2219  . aspirin EC tablet 325 mg  325 mg Oral Daily Maxie Barb, MD   325 mg at 09/27/13 2226  . docusate sodium (COLACE) capsule 100 mg  100 mg Oral BID Maxie Barb, MD   100 mg at 09/27/13 2219  . heparin injection 5,000 Units  5,000 Units Subcutaneous 3 times per day Maxie Barb, MD   5,000 Units at 09/28/13 [phone]  . labetalol (NORMODYNE,TRANDATE) injection 10 mg  10 mg Intravenous Q2H PRN Maxie Barb, MD      . levothyroxine (SYNTHROID, LEVOTHROID) tablet 25 mcg  25 mcg Oral QAC breakfast Maxie Barb, MD   25 mcg at 09/28/13 [phone]  . nicotine (NICODERM CQ - dosed in mg/24 hours) patch 21 mg  21 mg Transdermal Daily Roma Kayser Schorr, NP   21 mg at 09/27/13 2221  . ondansetron (ZOFRAN) tablet 4 mg  4 mg Oral Q6H PRN Maxie Barb, MD       Or  . ondansetron [facility]) injection 4 mg  4 mg  Intravenous Q6H PRN Maxie Barb, MD      . oxyCODONE (Oxy IR/ROXICODONE) immediate release tablet 5 mg  5 mg Oral Q4H PRN Maxie Barb, MD      . Gwyndolyn Kaufman [facility]) tablet 8.6 mg  1 tablet Oral BID Maxie Barb, MD   8.6 mg at 09/27/13 2219  . sodium chloride 0.9 % injection 3 mL  3 mL Intravenous Q12H Maxie Barb, MD   3 mL at 09/27/13 2101  . sorbitol 70 % solution 30 mL  30 mL Oral Daily PRN Maxie Barb, MD         Allergies  Allergen Reactions  . Sulfa Antibiotics Other (See Comments)    Childhood reaction (throat swelled)     Past Medical History  Diagnosis Date  . Thyroid disease     History reviewed. No pertinent past surgical history.  No family history on file.  Social History:  reports that he has been smoking.  He does not have any smokeless tobacco history on file. He reports that he does not drink alcohol or use illicit drugs.   Review of Systems: Constitutional:  admits to  fatigue.    HEENT: admits to   congestion,    Respiratory: admits to SOB, DOE, cough, chest tightness, and wheezing.  Cardiovascular: admits to chest pain, leg swelling.  Gastrointestinal: denies nausea, vomiting, abdominal pain, diarrhea, constipation, blood in stool.  Genitourinary: denies dysuria, urgency, frequency, hematuria, flank pain and difficulty urinating.  Musculoskeletal: denies  myalgias, back pain, joint swelling, arthralgias and gait problem.   Skin: denies pallor, rash and wound.  Neurological: denies dizziness, seizures, syncope, weakness, light-headedness, numbness and headaches.   Hematological: denies adenopathy, easy bruising, personal or family bleeding history.  Psychiatric/ Behavioral: denies suicidal ideation, mood changes, confusion, nervousness, sleep disturbance and agitation.    Physical Exam: BP 127/80  Pulse 72  Temp(Src) 97.6 F (36.4 C) (Oral)  Resp 20  Ht 6\' 4"  (1.93 m)  Wt 334 lb 14.4 oz (151.91 kg)  BMI 40.78 kg/m2  SpO2 100%  Wt Readings from Last 3 Encounters:  09/27/13 334 lb 14.4 oz (151.91 kg)    General: Vital signs reviewed and noted.  He is a fairly large man - mild - moderate obesity  Head: Normocephalic, atraumatic, sclera anicteric,   Neck: Supple. Negative for carotid bruits. No JVD   Lungs:  Tight bilateral wheezing.    Heart: RRR with S1 S2. No murmurs,  Abdomen:  Soft, non-tender, non-distended with normoactive bowel sounds. No hepatomegaly. No rebound/guarding. No obvious abdominal masses   MSK: Strength and the appear normal for age.   Extremities: No clubbing or cyanosis. No edema.  Distal pedal pulses are 2+ and equal   Neurologic: Alert and oriented X 3. Moves all extremities spontaneously.  Psych: Responds to questions appropriately with a normal affect.     Lab results: Basic Metabolic Panel:  Recent Labs Lab 09/27/13 1536 09/27/13 2147 09/28/13 0336  NA 139  --  139  K 4.0  --  3.9  CL 98  --  101  CO2 24  --  23   GLUCOSE 198*  --  187*  BUN 11  --  18  CREATININE 0.99 1.10 0.99  CALCIUM 9.6  --  9.1    Liver Function Tests: No results found for this basename: AST, ALT, ALKPHOS, BILITOT, PROT, ALBUMIN,  in the last 168 hours No results found for this basename: LIPASE, AMYLASE,  in the  last 168 hours No results found for this basename: AMMONIA,  in the last 168 hours  CBC:  Recent Labs Lab 09/27/13 1536 09/27/13 2147 09/28/13 0336  WBC 16.5* 14.4* 11.7*  HGB 16.1 15.4 14.3  HCT 45.0 43.8 41.5  MCV 93.9 95.2 96.5  PLT 301 293 262    Cardiac Enzymes:  Recent Labs Lab 09/27/13 2245 09/28/13 0336  TROPONINI <0.30 <0.30    BNP: No components found with this basename: POCBNP,   CBG: No results found for this basename: GLUCAP,  in the last 168 hours  Coagulation Studies: No results found for this basename: LABPROT, INR,  in the last 72 hours   Other results:  EKG :  Sinus tach at 126.  No ST or T wave changes   Imaging: X-ray Chest Pa And Lateral   09/27/2013   CLINICAL DATA:  Cough, fever and shortness of breath.  EXAM: CHEST  2 VIEW  COMPARISON:  08/27/2013  FINDINGS: The cardiomediastinal silhouette is unremarkable.  Mild peribronchial thickening again noted.  There is no evidence of focal airspace disease, pulmonary edema, suspicious pulmonary nodule/mass, pleural effusion, or pneumothorax. No acute bony abnormalities are identified.  Remote left rib fractures are again noted.  IMPRESSION: No evidence of acute cardiopulmonary disease.   Electronically Signed   By: Laveda Abbe M.D.   On: 09/27/2013 23:53   Ct Angio Chest Aortic Dissect W &/or W/o  09/27/2013   CLINICAL DATA:  Chest pain. Left-sided neck and abdominal pain. Syncopal episode.  EXAM: CT ANGIOGRAPHY CHEST, ABDOMEN AND PELVIS  TECHNIQUE: Multidetector CT imaging through the chest, abdomen and pelvis was performed using the standard protocol during bolus administration of intravenous contrast. Multiplanar  reconstructed images and MIPs were obtained and reviewed to evaluate the vascular anatomy.  CONTRAST:  OMNIPAQUE IOHEXOL 350 MG/ML SOLN  COMPARISON:  DG CHEST PORTABLE dated 08/27/2013  FINDINGS: CTA CHEST FINDINGS  Lungs/Pleura: Mild motion degradation. Suspect mild centrilobular emphysema. Subpleural lymph node at 3 mm along the right minor fissure on image 56/series 6. A lingular calcified granuloma on image 59/series 6.  No pleural fluid.  Minimal bilateral pleural thickening.  Heart/Mediastinum: No intramural hematoma on unenhanced images. No supraclavicular adenopathy. Mild soft tissue fullness of the thyroid gland, greater on the left. No well-defined focal lesion.  Normal aortic caliber without dissection. No mediastinal adenopathy. Calcified left hilar 1.5 cm node on image 57/series 5. Mildly prominent right hilar and infrahilar nodal tissue which is likely reactive.  Review of the MIP images confirms the above findings.  CTA ABDOMEN AND PELVIS FINDINGS  Abdomen/Pelvis: Mild hepatic steatosis. Normal spleen, stomach, pancreas, gallbladder, biliary tract, adrenal glands, right kidney. Too small to characterize lower pole left renal lesion on image 189/series 5.  Normal caliber of the abdominal aorta, without evidence of dissection. There are 2 right renal arteries. The inferior mesenteric artery is patent. No retroperitoneal or retrocrural adenopathy.  Normal colon, appendix, and terminal ileum. Normal small bowel without abdominal ascites. A fat containing left inguinal hernia. No pelvic adenopathy. The iliac vasculature is patent, without dissection or significant stenosis. Normal urinary bladder and prostate. No significant free fluid.  Bones/Musculoskeletal: Scattered pelvic bone islands. Remote left rib fractures.  Review of the MIP images confirms the above findings.  IMPRESSION: 1. No evidence above aortic dissection or aneurysm. 2. No other explanation for chest pain. 3. Old granulomatous  disease in the left side of the chest. 4. Mild hepatic steatosis.   Electronically Signed  By: Jeronimo GreavesKyle  Talbot M.D.   On: 09/27/2013 17:33   Ct Cta Abd/pel W/cm &/or W/o Cm  09/27/2013   CLINICAL DATA:  Chest pain. Left-sided neck and abdominal pain. Syncopal episode.  EXAM: CT ANGIOGRAPHY CHEST, ABDOMEN AND PELVIS  TECHNIQUE: Multidetector CT imaging through the chest, abdomen and pelvis was performed using the standard protocol during bolus administration of intravenous contrast. Multiplanar reconstructed images and MIPs were obtained and reviewed to evaluate the vascular anatomy.  CONTRAST:  100mL OMNIPAQUE IOHEXOL 350 MG/ML SOLN  COMPARISON:  DG CHEST PORTABLE dated 08/27/2013  FINDINGS: CTA CHEST FINDINGS  Lungs/Pleura: Mild motion degradation. Suspect mild centrilobular emphysema. Subpleural lymph node at 3 mm along the right minor fissure on image 56/series 6. A lingular calcified granuloma on image 59/series 6.  No pleural fluid.  Minimal bilateral pleural thickening.  Heart/Mediastinum: No intramural hematoma on unenhanced images. No supraclavicular adenopathy. Mild soft tissue fullness of the thyroid gland, greater on the left. No well-defined focal lesion.  Normal aortic caliber without dissection. No mediastinal adenopathy. Calcified left hilar 1.5 cm node on image 57/series 5. Mildly prominent right hilar and infrahilar nodal tissue which is likely reactive.  Review of the MIP images confirms the above findings.  CTA ABDOMEN AND PELVIS FINDINGS  Abdomen/Pelvis: Mild hepatic steatosis. Normal spleen, stomach, pancreas, gallbladder, biliary tract, adrenal glands, right kidney. Too small to characterize lower pole left renal lesion on image 189/series 5.  Normal caliber of the abdominal aorta, without evidence of dissection. There are 2 right renal arteries. The inferior mesenteric artery is patent. No retroperitoneal or retrocrural adenopathy.  Normal colon, appendix, and terminal ileum. Normal small  bowel without abdominal ascites. A fat containing left inguinal hernia. No pelvic adenopathy. The iliac vasculature is patent, without dissection or significant stenosis. Normal urinary bladder and prostate. No significant free fluid.  Bones/Musculoskeletal: Scattered pelvic bone islands. Remote left rib fractures.  Review of the MIP images confirms the above findings.  IMPRESSION: 1. No evidence above aortic dissection or aneurysm. 2. No other explanation for chest pain. 3. Old granulomatous disease in the left side of the chest. 4. Mild hepatic steatosis.   Electronically Signed   By: Jeronimo GreavesKyle  Talbot M.D.   On: 09/27/2013 17:33        Assessment & Plan:  1. Chest discomfort: The patient presents with an upper respiratory tract infection. His white blood cell count is elevated. He has tight wheezing bilaterally.  His symptoms are somewhat atypical for coronary ischemia.  He does have a history of cigarette smoking and hyperlipidemia and I do agree that at some point he should have a stress Myoview study. I do not think he is ready for Myoview study today because of his tight wheezing. In addition, his symptoms are consistent with influenza.  His cardiac enzymes are negative. His EKG is unremarkable.    2. Wheezing: The patient presents with upper respiratory tract infection. He was written for albuterol nebulizer on an as-needed basis. I think that he needs scheduled albuterol for the next several days.   He may also need a steroid taper but we will leave that up to Dr. Isidoro Donningai.   He's wheezing just with normal respirations. I do not think that he'll tolerate a stress test at this point.  3. Congestive heart failure: At this time we're not able to further differentiate exactly what kind of congestive heart failure he has but he certainly has symptoms consistent with acute on chronic diastolic congestive heart failure. He  has a history of leg edema and dyspnea on exertion. His chest x-ray did not reveal any  pulmonary edema on admission. We'll get an echocardiogram for further evaluation.  4. Hyperlipidemia:  his cholesterol levels are markedly elevated. Lab Results  Component Value Date   CHOL 302* 09/27/2013   HDL 33* 09/27/2013   LDLCALC UNABLE TO CALCULATE IF TRIGLYCERIDE OVER 400 mg/dL 1/61/0960   TRIG 454* 0/98/1191   CHOLHDL 9.2 09/27/2013   Some of this may be attributed to his hypothyroidism. He does not follow any specific diet and in fact eats a relatively high fat diet. We will start him on atorvastatin 20 mg a day. I suspect that he'll need a higher dose of Synthroid.  5. Hypothyroidism:  He  is on a very low dose of Synthroid. This will be managed by Dr. Isidoro Donning.     Vesta Mixer, Montez Hageman., MD, [facility] 09/28/2013, 7:52 AM Office - [phone] Pager [phone]

## 2013-09-29 DIAGNOSIS — I517 Cardiomegaly: Secondary | ICD-10-CM

## 2013-09-29 LAB — BASIC METABOLIC PANEL
BUN: 13 mg/dL (ref 6–23)
CHLORIDE: 94 meq/L — AB (ref 96–112)
CO2: 23 mEq/L (ref 19–32)
Calcium: 9.5 mg/dL (ref 8.4–10.5)
Creatinine, Ser: 0.98 mg/dL (ref 0.50–1.35)
GFR calc Af Amer: >90 mL/min (ref >90)
GFR calc non Af Amer: >90 mL/min (ref >90)
GLUCOSE: 235 mg/dL — AB (ref 70–99)
Potassium: 3.9 mEq/L (ref 3.7–5.3)
Sodium: 136 mEq/L — ABNORMAL LOW (ref 137–147)

## 2013-09-29 LAB — CBC
HEMATOCRIT: 42.4 % (ref 39.0–52.0)
Hemoglobin: 14.7 g/dL (ref 13.0–17.0)
MCH: 33.1 pg (ref 26.0–34.0)
MCHC: 34.7 g/dL (ref 30.0–36.0)
MCV: 95.5 fL (ref 78.0–100.0)
Platelets: 294 10*3/uL (ref 150–400)
RBC: 4.44 MIL/uL (ref 4.22–5.81)
RDW: 13.4 % (ref 11.5–15.5)
WBC: 12 10*3/uL — AB (ref 4.0–10.5)

## 2013-09-29 LAB — THYROGLOBULIN ANTIBODY: Thyroglobulin Ab: 14879 U/mL — ABNORMAL HIGH (ref <40.0)

## 2013-09-29 LAB — THYROID PEROXIDASE ANTIBODY: THYROID PEROXIDASE ANTIBODY: 7273 [IU]/mL — AB (ref <35.0)

## 2013-09-29 MED ORDER — METHYLPREDNISOLONE SODIUM SUCC 125 MG IJ SOLR
60.0000 mg | Freq: Two times a day (BID) | INTRAMUSCULAR | Status: DC
  Administered 2013-09-29: 60 mg via INTRAVENOUS
  Filled 2013-09-29 (×3): qty 0.96

## 2013-09-29 MED ORDER — ALBUTEROL SULFATE (2.5 MG/3ML) 0.083% IN NEBU: 2.5000 mg | INHALATION_SOLUTION | RESPIRATORY_TRACT | Status: DC | PRN

## 2013-09-29 MED ORDER — IPRATROPIUM-ALBUTEROL 0.5-2.5 (3) MG/3ML IN SOLN
3.0000 mL | Freq: Three times a day (TID) | RESPIRATORY_TRACT | Status: DC
  Administered 2013-09-29 (×2): 3 mL via RESPIRATORY_TRACT
  Filled 2013-09-29 (×2): qty 3

## 2013-09-29 NOTE — Consult Note (Signed)
CONSULT NOTE  Date: 09/29/2013               Patient Name:  Darryl Green MRN: [id number]  DOB: 30-Aug-1970 Age / Sex: 43 y.o., male        PCP: No PCP Per Patient Primary Cardiologist: New / Keela Rubert            Referring Physician: Rod Can, MD              Reason for Consult: Chest pain, dyspnea, leg swelling           History of Present Illness: Patient is a 43 y.o. male with a PMHx of obesity , who was admitted to [facility] on 09/27/2013 for evaluation of  Cough, wheezing, dyspnea.  He has a long hx of obesity and was recently diagnosed with hypothyroidism. He just started on Synthroid.    He reports having shortness breath with exertion as well as leg edema that started several months ago. His legs would swell and become blue in the afternoons and evenings if he sat at his desk for any length of time. 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 This dyspnea with exertion and leg edema has been gradually worsening. This past weekend he developed a cough productive of yellow-green sputum. He developed some intrascapular pain which moved around to the service chest. There was some pleuritic component to the chest pain.  He was admitted for further evaluation.  His white blood cell count was elevated. He was hypertensive. His troponin levels are negative.  His EKG showed sinus tachycardia 126 but no ST or T wave changes.  Feb. 26, 2015:  He is having more cough and pleuretic CP today.   Troponin levels are negative.    Medications: Outpatient medications: Prescriptions prior to admission  Medication Sig Dispense Refill  . levothyroxine (SYNTHROID, LEVOTHROID) 25 MCG tablet Take 25 mcg by mouth daily before breakfast.        Current medications: Current Facility-Administered Medications  Medication Dose Route Frequency Provider Last Rate Last Dose  . albuterol (PROVENTIL) (2.5 MG/3ML) 0.083% nebulizer  solution 2.5 mg  2.5 mg Nebulization Q2H PRN Ripudeep K Rai, MD      . amLODipine (NORVASC) tablet 5 mg  5 mg Oral Daily Maxie Barb, MD   5 mg at 09/28/13 1027  . aspirin EC tablet 325 mg  325 mg Oral Daily Maxie Barb, MD   325 mg at 09/28/13 1027  . atorvastatin (LIPITOR) tablet 20 mg  20 mg Oral q1800 Vesta Mixer, MD   20 mg at 09/28/13 1724  . azithromycin (ZITHROMAX) 500 mg in dextrose 5 % 250 mL IVPB  500 mg Intravenous Q24H Ripudeep K Rai, MD   500 mg at 09/28/13 1724  . benzonatate (TESSALON) capsule 100 mg  100 mg Oral TID Ripudeep K Rai, MD   100 mg at 09/28/13 2211  . budesonide-formoterol (SYMBICORT) 80-4.5 MCG/ACT inhaler 2 puff  2 puff Inhalation BID Ripudeep Jenna Luo, MD   2 puff at 09/29/13 0754  . docusate sodium (COLACE) capsule 100 mg  100 mg Oral BID Maxie Barb, MD   100 mg at 09/28/13 2211  . furosemide (LASIX) tablet 40 mg  40 mg Oral Daily Ripudeep Jenna Luo, MD   40 mg at 09/28/13 1724  . heparin injection 5,000 Units  5,000 Units Subcutaneous 3 times per  day Maxie Barb, MD   5,000 Units at 09/29/13 [phone]  . ipratropium-albuterol (DUONEB) 0.5-2.5 (3) MG/3ML nebulizer solution 3 mL  3 mL Nebulization TID Ripudeep Jenna Luo, MD   3 mL at 09/29/13 0754  . labetalol (NORMODYNE,TRANDATE) injection 10 mg  10 mg Intravenous Q2H PRN Maxie Barb, MD      . levothyroxine (SYNTHROID, LEVOTHROID) tablet 50 mcg  50 mcg Oral QAC breakfast Ripudeep Jenna Luo, MD   50 mcg at 09/29/13 [phone]  . methylPREDNISolone sodium succinate (SOLU-MEDROL) 125 mg/2 mL injection 60 mg  60 mg Intravenous Q6H Ripudeep K Rai, MD   60 mg at 09/29/13 0349  . nicotine (NICODERM CQ - dosed in mg/24 hours) patch 21 mg  21 mg Transdermal Daily Roma Kayser Schorr, NP   21 mg at 09/28/13 1027  . ondansetron (ZOFRAN) tablet 4 mg  4 mg Oral Q6H PRN Maxie Barb, MD       Or  . ondansetron [facility]) injection 4 mg  4 mg Intravenous Q6H PRN Maxie Barb, MD      . oxyCODONE (Oxy IR/ROXICODONE) immediate  release tablet 5 mg  5 mg Oral Q4H PRN Maxie Barb, MD   5 mg at 09/28/13 2211  . senna (SENOKOT) tablet 8.6 mg  1 tablet Oral BID Maxie Barb, MD   8.6 mg at 09/28/13 2211  . sodium chloride 0.9 % injection 3 mL  3 mL Intravenous Q12H Maxie Barb, MD   3 mL at 09/27/13 2101  . sorbitol 70 % solution 30 mL  30 mL Oral Daily PRN Maxie Barb, MD         Allergies  Allergen Reactions  . Sulfa Antibiotics Other (See Comments)    Childhood reaction (throat swelled)     Past Medical History  Diagnosis Date  . Thyroid disease   . Essential hypertension, benign 09/28/2013    History reviewed. No pertinent past surgical history.  No family history on file.  Social History:  reports that he has been smoking.  He does not have any smokeless tobacco history on file. He reports that he does not drink alcohol or use illicit drugs.   Physical Exam: BP 148/78  Pulse 112  Temp(Src) 97.4 F (36.3 C) (Oral)  Resp 18  Ht 6\' 4"  (1.93 m)  Wt 332 lb 13.9 oz (150.987 kg)  BMI 40.53 kg/m2  SpO2 98%  Wt Readings from Last 3 Encounters:  09/29/13 332 lb 13.9 oz (150.987 kg)    General: Vital signs reviewed and noted.  He is a fairly large man - mild - moderate obesity  Head: Normocephalic, atraumatic, sclera anicteric,   Neck: Supple. Negative for carotid bruits. No JVD   Lungs:  Slightly better, still has wheezing on forced expiration    Heart: RRR with S1 S2. No murmurs,  Abdomen:  Soft, non-tender, non-distended with normoactive bowel sounds. No hepatomegaly. No rebound/guarding. No obvious abdominal masses   MSK: Strength and the appear normal for age.   Extremities: No clubbing or cyanosis. No edema.  Distal pedal pulses are 2+ and equal   Neurologic: Alert and oriented X 3. Moves all extremities spontaneously.  Psych: Responds to questions appropriately with a normal affect.     Lab results: Basic Metabolic Panel:  Recent Labs Lab 09/27/13 1536 09/27/13 2147  09/28/13 0336 09/29/13 0342  NA 139  --  139 136*  K 4.0  --  3.9 3.9  CL 98  --  101 94*  CO2 24  --  23 23  GLUCOSE 198*  --  187* 235*  BUN 11  --  18 13  CREATININE 0.99 1.10 0.99 0.98  CALCIUM 9.6  --  9.1 9.5    Liver Function Tests: No results found for this basename: AST, ALT, ALKPHOS, BILITOT, PROT, ALBUMIN,  in the last 168 hours No results found for this basename: LIPASE, AMYLASE,  in the last 168 hours No results found for this basename: AMMONIA,  in the last 168 hours  CBC:  Recent Labs Lab 09/27/13 1536 09/27/13 2147 09/28/13 0336 09/29/13 0342  WBC 16.5* 14.4* 11.7* 12.0*  HGB 16.1 15.4 14.3 14.7  HCT 45.0 43.8 41.5 42.4  MCV 93.9 95.2 96.5 95.5  PLT 301 293 262 294    Cardiac Enzymes:  Recent Labs Lab 09/27/13 2245 09/28/13 0336 09/28/13 0920  TROPONINI <0.30 <0.30 <0.30    BNP: No components found with this basename: POCBNP,   CBG: No results found for this basename: GLUCAP,  in the last 168 hours  Coagulation Studies: No results found for this basename: LABPROT, INR,  in the last 72 hours   Other results:  EKG :  Sinus tach at 126.  No ST or T wave changes   Imaging: X-ray Chest Pa And Lateral   09/27/2013   CLINICAL DATA:  Cough, fever and shortness of breath.  EXAM: CHEST  2 VIEW  COMPARISON:  08/27/2013  FINDINGS: The cardiomediastinal silhouette is unremarkable.  Mild peribronchial thickening again noted.  There is no evidence of focal airspace disease, pulmonary edema, suspicious pulmonary nodule/mass, pleural effusion, or pneumothorax. No acute bony abnormalities are identified.  Remote left rib fractures are again noted.  IMPRESSION: No evidence of acute cardiopulmonary disease.   Electronically Signed   By: Laveda AbbeJeff  Hu M.D.   On: 09/27/2013 23:53   [place] Soft Tissue Head/neck  09/28/2013   CLINICAL DATA:  Thyroid disease.  EXAM: THYROID ULTRASOUND  TECHNIQUE: Ultrasound examination of the thyroid gland and adjacent soft tissues was  performed.  COMPARISON:  CT ANGIO CHEST AORTA W/CM & WO/CM dated 09/27/2013  FINDINGS: Right thyroid lobe  Measurements: 5.6 x 3.4 x 2.0 cm. No nodules visualized. Heterogeneous echotexture.  Left thyroid lobe  Measurements: By 0.9 x 3.4 x 2.5 cm. No nodules visualized. Heterogeneous echotexture.  Isthmus  Thickness: 0.8 cm.  No nodules visualized.  Lymph nodes  1.4 x 0.4 x 1.1 cm right cervical lymph node.  IMPRESSION: Thyroid gland is prominent with heterogeneous echotexture noted diffusely. No focal mass lesion. These findings suggest thyroiditis.   Electronically Signed   By: Maisie Fushomas  Register   On: 09/28/2013 14:32   Ct Angio Chest Aortic Dissect W &/or W/o  09/27/2013   CLINICAL DATA:  Chest pain. Left-sided neck and abdominal pain. Syncopal episode.  EXAM: CT ANGIOGRAPHY CHEST, ABDOMEN AND PELVIS  TECHNIQUE: Multidetector CT imaging through the chest, abdomen and pelvis was performed using the standard protocol during bolus administration of intravenous contrast. Multiplanar reconstructed images and MIPs were obtained and reviewed to evaluate the vascular anatomy.  CONTRAST:  100mL OMNIPAQUE IOHEXOL 350 MG/ML SOLN  COMPARISON:  DG CHEST PORTABLE dated 08/27/2013  FINDINGS: CTA CHEST FINDINGS  Lungs/Pleura: Mild motion degradation. Suspect mild centrilobular emphysema. Subpleural lymph node at 3 mm along the right minor fissure on image 56/series 6. A lingular calcified granuloma on image 59/series 6.  No pleural fluid.  Minimal bilateral pleural thickening.  Heart/Mediastinum: No intramural hematoma on unenhanced  images. No supraclavicular adenopathy. Mild soft tissue fullness of the thyroid gland, greater on the left. No well-defined focal lesion.  Normal aortic caliber without dissection. No mediastinal adenopathy. Calcified left hilar 1.5 cm node on image 57/series 5. Mildly prominent right hilar and infrahilar nodal tissue which is likely reactive.  Review of the MIP images confirms the above findings.   CTA ABDOMEN AND PELVIS FINDINGS  Abdomen/Pelvis: Mild hepatic steatosis. Normal spleen, stomach, pancreas, gallbladder, biliary tract, adrenal glands, right kidney. Too small to characterize lower pole left renal lesion on image 189/series 5.  Normal caliber of the abdominal aorta, without evidence of dissection. There are 2 right renal arteries. The inferior mesenteric artery is patent. No retroperitoneal or retrocrural adenopathy.  Normal colon, appendix, and terminal ileum. Normal small bowel without abdominal ascites. A fat containing left inguinal hernia. No pelvic adenopathy. The iliac vasculature is patent, without dissection or significant stenosis. Normal urinary bladder and prostate. No significant free fluid.  Bones/Musculoskeletal: Scattered pelvic bone islands. Remote left rib fractures.  Review of the MIP images confirms the above findings.  IMPRESSION: 1. No evidence above aortic dissection or aneurysm. 2. No other explanation for chest pain. 3. Old granulomatous disease in the left side of the chest. 4. Mild hepatic steatosis.   Electronically Signed   By: Jeronimo Greaves M.D.   On: 09/27/2013 17:33   Ct Cta Abd/pel W/cm &/or W/o Cm  09/27/2013   CLINICAL DATA:  Chest pain. Left-sided neck and abdominal pain. Syncopal episode.  EXAM: CT ANGIOGRAPHY CHEST, ABDOMEN AND PELVIS  TECHNIQUE: Multidetector CT imaging through the chest, abdomen and pelvis was performed using the standard protocol during bolus administration of intravenous contrast. Multiplanar reconstructed images and MIPs were obtained and reviewed to evaluate the vascular anatomy.  CONTRAST:  OMNIPAQUE IOHEXOL 350 MG/ML SOLN  COMPARISON:  DG CHEST PORTABLE dated 08/27/2013  FINDINGS: CTA CHEST FINDINGS  Lungs/Pleura: Mild motion degradation. Suspect mild centrilobular emphysema. Subpleural lymph node at 3 mm along the right minor fissure on image 56/series 6. A lingular calcified granuloma on image 59/series 6.  No pleural fluid.   Minimal bilateral pleural thickening.  Heart/Mediastinum: No intramural hematoma on unenhanced images. No supraclavicular adenopathy. Mild soft tissue fullness of the thyroid gland, greater on the left. No well-defined focal lesion.  Normal aortic caliber without dissection. No mediastinal adenopathy. Calcified left hilar 1.5 cm node on image 57/series 5. Mildly prominent right hilar and infrahilar nodal tissue which is likely reactive.  Review of the MIP images confirms the above findings.  CTA ABDOMEN AND PELVIS FINDINGS  Abdomen/Pelvis: Mild hepatic steatosis. Normal spleen, stomach, pancreas, gallbladder, biliary tract, adrenal glands, right kidney. Too small to characterize lower pole left renal lesion on image 189/series 5.  Normal caliber of the abdominal aorta, without evidence of dissection. There are 2 right renal arteries. The inferior mesenteric artery is patent. No retroperitoneal or retrocrural adenopathy.  Normal colon, appendix, and terminal ileum. Normal small bowel without abdominal ascites. A fat containing left inguinal hernia. No pelvic adenopathy. The iliac vasculature is patent, without dissection or significant stenosis. Normal urinary bladder and prostate. No significant free fluid.  Bones/Musculoskeletal: Scattered pelvic bone islands. Remote left rib fractures.  Review of the MIP images confirms the above findings.  IMPRESSION: 1. No evidence above aortic dissection or aneurysm. 2. No other explanation for chest pain. 3. Old granulomatous disease in the left side of the chest. 4. Mild hepatic steatosis.   Electronically Signed   By: Ronaldo Miyamoto  Reche Dixon M.D.   On: 09/27/2013 17:33       Assessment & Plan:  1. Chest discomfort: The patient presents with an upper respiratory tract infection. His white blood cell count is elevated. He has tight wheezing bilaterally.  His symptoms are somewhat atypical for coronary ischemia.  He does have a history of cigarette smoking and hyperlipidemia and I  do agree that at some point he should have a stress Myoview study. I do not think he is ready for Myoview study today because of his tight wheezing. In addition, his symptoms are consistent with influenza.  His cardiac enzymes are negative. His EKG is unremarkable.   Will hopefully get the echo today. At this point, I do not think that he has any acute cardiac issues.  We will sign off.  Call for questions.   2. Wheezing: The patient presents with upper respiratory tract infection. He is on scheduled albuterol and is now on steroids.  Markers for flue are negative.  3. Congestive heart failure: At this time we're not able to further differentiate exactly what kind of congestive heart failure he has but he certainly has symptoms consistent with acute on chronic diastolic congestive heart failure. He has a history of leg edema and dyspnea on exertion. His chest x-ray did not reveal any pulmonary edema on admission. We'll get an echocardiogram for further evaluation.  4. Hyperlipidemia:  his cholesterol levels are markedly elevated. Lab Results  Component Value Date   CHOL 302* 09/27/2013   HDL 33* 09/27/2013   LDLCALC UNABLE TO CALCULATE IF TRIGLYCERIDE OVER 400 mg/dL 09/17/863   TRIG 784* 6/96/2952   CHOLHDL 9.2 09/27/2013   Some of this may be attributed to his hypothyroidism. He does not follow any specific diet and in fact eats a relatively high fat diet. We will start him on atorvastatin 20 mg a day. I suspect that he'll need a higher dose of Synthroid.  5. Hypothyroidism:  He  is on a very low dose of Synthroid. This will be managed by Dr. Isidoro Donning.    Vesta Mixer, Montez Hageman., MD, [facility] 09/29/2013, 9:16 AM Office - [phone] Pager [phone]

## 2013-09-29 NOTE — Progress Notes (Signed)
Patient changed to inpatient- requiring IV solumedrol Q6

## 2013-09-29 NOTE — Progress Notes (Signed)
Patient ID: Darryl Green  male  IYM:[id number]    DOB: [birth year]    DOA: 09/27/2013  PCP: No PCP Per Patient  Assessment/Plan: Principal Problem:   Acute bronchitis: -Improving, continue scheduled nebs q4hours, Symbicort, Zithromax, flutter valve,  taper Solu-Medrol IV - If improving, will transitioned to oral prednisone in a.m. - Influenza panel negative, strep throat negative, RSV panel pending  Active Problems:   Chest pain: Likely pleuritic, anyways would not be able to tolerate stress test at this time due to acute bronchitis - Ruled out for acute ACS, - Will plan on outpatient stress test - 2d echo showed EF of 76-80%, grade 1 diastolic dysfunction no regional wall motion abnormalities - Continue aspirin, treat risk factors    Other and unspecified hyperlipidemia - Lipid panel showed cholesterol 302 triglycerides 443 - Started on statins, counseled on diet and weight control    Unspecified hypothyroidism - Patient did have tenderness on the left side of the neck, thyroid ultrasound done, suggestive of possibly thyroiditis - ESR, CRP with in normal range, thyroglobulin antibodies, thyroid peroxidase antibody, T3 - TSH 16.285, free T4 0.8,  T3 low at 75.2. discussed in detail with Dr. Elayne Snare on 2/25, endocrinology, recommended increasing Synthroid to 50 MCG daily. I made an appointment with Dr. Dwyane Dee on 10/17/13 on [facility] for follow up    Morbid obesity - Patient strongly counseled on diet and weight control    Essential hypertension, benign Currently stable, continue Norvasc   DVT Prophylaxis:Heparin subcutaneous   Code Status:  Family Communication:Discussed with patient and his wife at the bedside , Hopefully DC in a.m.  Disposition:Remain inpatient   Consultants:  Cardiology   Endocrinology, Dr. Elayne Snare   Procedures:  Thyroid ultrasound    CT A chest and abd Negative for PE or aortic dissection    Antibiotics:  AZithromax    Subjective: Wheezing improving, feeling deconditioned no fevers or chills coughing improving  Objective: Weight change: -0.923 kg (-2 lb 0.5 oz)  Intake/Output Summary (Last 24 hours) at 09/29/13 1457 Last data filed at 09/29/13 1300  Gross per 24 hour  Intake   1395 ml  Output   1100 ml  Net    295 ml   Blood pressure 143/67, pulse 113, temperature 98.1 F (36.7 C), temperature source Oral, resp. rate 17, height 6' 4"  (1.93 m), weight 150.987 kg (332 lb 13.9 oz), SpO2 98.00%.  Physical Exam: General: Alert and awake, oriented x3, not in any acute distress. CVS: S1-S2 clear, no murmur rubs or gallops Chest: Wheezing improving  Abdomen:  obese soft nontender, nondistended, normal bowel sounds  Extremities: no cyanosis, clubbing or 1+ edema noted bilaterally   Lab Results: Basic Metabolic Panel:  Recent Labs Lab 09/28/13 0336 09/29/13 0342  NA 139 136*  K 3.9 3.9  CL 101 94*  CO2 23 23  GLUCOSE 187* 235*  BUN 18 13  CREATININE 0.99 0.98  CALCIUM 9.1 9.5   Liver Function Tests: No results found for this basename: AST, ALT, ALKPHOS, BILITOT, PROT, ALBUMIN,  in the last 168 hours No results found for this basename: LIPASE, AMYLASE,  in the last 168 hours No results found for this basename: AMMONIA,  in the last 168 hours CBC:  Recent Labs Lab 09/28/13 0336 09/29/13 0342  WBC 11.7* 12.0*  HGB 14.3 14.7  HCT 41.5 42.4  MCV 96.5 95.5  PLT 262 294   Cardiac Enzymes:  Recent Labs Lab 09/27/13 2245 09/28/13 0336 09/28/13 0920  TROPONINI <0.30 <0.30 <0.30   BNP: No components found with this basename: POCBNP,  CBG: No results found for this basename: GLUCAP,  in the last 168 hours   Micro Results: Recent Results (from the past 240 hour(s))  RESPIRATORY VIRUS PANEL     Status: None   Collection Time    09/27/13 11:43 PM      Result Value Ref Range Status   Source - [facility] NASAL SWAB   Corrected   Comment: CORRECTED  ON 02/25 AT 1823: PREVIOUSLY REPORTED AS NASAL SWAB   Respiratory Syncytial Virus A NOT DETECTED   Final   Respiratory Syncytial Virus B NOT DETECTED   Final   Influenza A NOT DETECTED   Final   Influenza B NOT DETECTED   Final   Parainfluenza 1 NOT DETECTED   Final   Parainfluenza 2 NOT DETECTED   Final   Parainfluenza 3 NOT DETECTED   Final   Metapneumovirus NOT DETECTED   Final   Rhinovirus NOT DETECTED   Final   Adenovirus NOT DETECTED   Final   Influenza A H1 NOT DETECTED   Final   Influenza A H3 NOT DETECTED   Final   Comment: (NOTE)           Normal Reference Range for each Analyte: NOT DETECTED     Testing performed using the Lumine[employer] xTAG Respiratory Viral Panel test     kit.     This test was developed and its performance characteristics determined     by [employer]. It has not been cleared or approved by the [place]     Food and Drug Administration. This test is used for clinical purposes.     It should not be regarded as investigational or for research. This     laboratory is certified under the [place] (CLIA) as qualified to perform high complexity     clinical laboratory testing.     Performed at [facility]     Status: None   Collection Time    09/28/13  5:37 PM      Result Value Ref Range Status   Streptococcus, Group A Screen (Direct) NEGATIVE  NEGATIVE Final   Comment: (NOTE)     A Rapid Antigen test may result negative if the antigen level in the     sample is below the detection level of this test. The FDA has not     cleared this test as a stand-alone test therefore the rapid antigen     negative result has reflexed to a Group A Strep culture.    Studies/Results: X-ray Chest Pa And Lateral   09/27/2013   CLINICAL DATA:  Cough, fever and shortness of breath.  EXAM: CHEST  2 VIEW  COMPARISON:  08/27/2013  FINDINGS: The cardiomediastinal silhouette is unremarkable.  Mild  peribronchial thickening again noted.  There is no evidence of focal airspace disease, pulmonary edema, suspicious pulmonary nodule/mass, pleural effusion, or pneumothorax. No acute bony abnormalities are identified.  Remote left rib fractures are again noted.  IMPRESSION: No evidence of acute cardiopulmonary disease.   Electronically Signed   By: Hassan Rowan M.D.   On: 09/27/2013 23:53   US Soft Tissue Head/neck  09/28/2013   CLINICAL DATA:  Thyroid disease.  EXAM: THYROID ULTRASOUND  TECHNIQUE: Ultrasound examination of the thyroid gland and adjacent soft tissues was performed.  COMPARISON:  CT ANGIO CHEST  AORTA W/CM & WO/CM dated 09/27/2013  FINDINGS: Right thyroid lobe  Measurements: 5.6 x 3.4 x 2.0 cm. No nodules visualized. Heterogeneous echotexture.  Left thyroid lobe  Measurements: By 0.9 x 3.4 x 2.5 cm. No nodules visualized. Heterogeneous echotexture.  Isthmus  Thickness: 0.8 cm.  No nodules visualized.  Lymph nodes  1.4 x 0.4 x 1.1 cm right cervical lymph node.  IMPRESSION: Thyroid gland is prominent with heterogeneous echotexture noted diffusely. No focal mass lesion. These findings suggest thyroiditis.   Electronically Signed   By: Marcello Moores  Register   On: 09/28/2013 14:32   Ct Angio Chest Aortic Dissect W &/or W/o  09/27/2013   CLINICAL DATA:  Chest pain. Left-sided neck and abdominal pain. Syncopal episode.  EXAM: CT ANGIOGRAPHY CHEST, ABDOMEN AND PELVIS  TECHNIQUE: Multidetector CT imaging through the chest, abdomen and pelvis was performed using the standard protocol during bolus administration of intravenous contrast. Multiplanar reconstructed images and MIPs were obtained and reviewed to evaluate the vascular anatomy.  CONTRAST:  151m OMNIPAQUE IOHEXOL 350 MG/ML SOLN  COMPARISON:  DG CHEST PORTABLE dated 08/27/2013  FINDINGS: CTA CHEST FINDINGS  Lungs/Pleura: Mild motion degradation. Suspect mild centrilobular emphysema. Subpleural lymph node at 3 mm along the right minor fissure on image  56/series 6. A lingular calcified granuloma on image 59/series 6.  No pleural fluid.  Minimal bilateral pleural thickening.  Heart/Mediastinum: No intramural hematoma on unenhanced images. No supraclavicular adenopathy. Mild soft tissue fullness of the thyroid gland, greater on the left. No well-defined focal lesion.  Normal aortic caliber without dissection. No mediastinal adenopathy. Calcified left hilar 1.5 cm node on image 57/series 5. Mildly prominent right hilar and infrahilar nodal tissue which is likely reactive.  Review of the MIP images confirms the above findings.  CTA ABDOMEN AND PELVIS FINDINGS  Abdomen/Pelvis: Mild hepatic steatosis. Normal spleen, stomach, pancreas, gallbladder, biliary tract, adrenal glands, right kidney. Too small to characterize lower pole left renal lesion on image 189/series 5.  Normal caliber of the abdominal aorta, without evidence of dissection. There are 2 right renal arteries. The inferior mesenteric artery is patent. No retroperitoneal or retrocrural adenopathy.  Normal colon, appendix, and terminal ileum. Normal small bowel without abdominal ascites. A fat containing left inguinal hernia. No pelvic adenopathy. The iliac vasculature is patent, without dissection or significant stenosis. Normal urinary bladder and prostate. No significant free fluid.  Bones/Musculoskeletal: Scattered pelvic bone islands. Remote left rib fractures.  Review of the MIP images confirms the above findings.  IMPRESSION: 1. No evidence above aortic dissection or aneurysm. 2. No other explanation for chest pain. 3. Old granulomatous disease in the left side of the chest. 4. Mild hepatic steatosis.   Electronically Signed   By: KAbigail MiyamotoM.D.   On: 09/27/2013 17:33   Ct Cta Abd/pel W/cm &/or W/o Cm  09/27/2013   CLINICAL DATA:  Chest pain. Left-sided neck and abdominal pain. Syncopal episode.  EXAM: CT ANGIOGRAPHY CHEST, ABDOMEN AND PELVIS  TECHNIQUE: Multidetector CT imaging through the chest,  abdomen and pelvis was performed using the standard protocol during bolus administration of intravenous contrast. Multiplanar reconstructed images and MIPs were obtained and reviewed to evaluate the vascular anatomy.  CONTRAST:  1035mOMNIPAQUE IOHEXOL 350 MG/ML SOLN  COMPARISON:  DG CHEST PORTABLE dated 08/27/2013  FINDINGS: CTA CHEST FINDINGS  Lungs/Pleura: Mild motion degradation. Suspect mild centrilobular emphysema. Subpleural lymph node at 3 mm along the right minor fissure on image 56/series 6. A lingular calcified granuloma on image 59/series 6.  No pleural fluid.  Minimal bilateral pleural thickening.  Heart/Mediastinum: No intramural hematoma on unenhanced images. No supraclavicular adenopathy. Mild soft tissue fullness of the thyroid gland, greater on the left. No well-defined focal lesion.  Normal aortic caliber without dissection. No mediastinal adenopathy. Calcified left hilar 1.5 cm node on image 57/series 5. Mildly prominent right hilar and infrahilar nodal tissue which is likely reactive.  Review of the MIP images confirms the above findings.  CTA ABDOMEN AND PELVIS FINDINGS  Abdomen/Pelvis: Mild hepatic steatosis. Normal spleen, stomach, pancreas, gallbladder, biliary tract, adrenal glands, right kidney. Too small to characterize lower pole left renal lesion on image 189/series 5.  Normal caliber of the abdominal aorta, without evidence of dissection. There are 2 right renal arteries. The inferior mesenteric artery is patent. No retroperitoneal or retrocrural adenopathy.  Normal colon, appendix, and terminal ileum. Normal small bowel without abdominal ascites. A fat containing left inguinal hernia. No pelvic adenopathy. The iliac vasculature is patent, without dissection or significant stenosis. Normal urinary bladder and prostate. No significant free fluid.  Bones/Musculoskeletal: Scattered pelvic bone islands. Remote left rib fractures.  Review of the MIP images confirms the above findings.   IMPRESSION: 1. No evidence above aortic dissection or aneurysm. 2. No other explanation for chest pain. 3. Old granulomatous disease in the left side of the chest. 4. Mild hepatic steatosis.   Electronically Signed   By: Abigail Miyamoto M.D.   On: 09/27/2013 17:33    Medications: Scheduled Meds: . amLODipine  5 mg Oral Daily  . aspirin EC  325 mg Oral Daily  . atorvastatin  20 mg Oral q1800  . azithromycin  500 mg Intravenous Q24H  . benzonatate  100 mg Oral TID  . budesonide-formoterol  2 puff Inhalation BID  . docusate sodium  100 mg Oral BID  . furosemide  40 mg Oral Daily  . heparin  5,000 Units Subcutaneous 3 times per day  . ipratropium-albuterol  3 mL Nebulization TID  . levothyroxine  50 mcg Oral QAC breakfast  . methylPREDNISolone (SOLU-MEDROL) injection  60 mg Intravenous Q6H  . nicotine  21 mg Transdermal Daily  . senna  1 tablet Oral BID  . sodium chloride  3 mL Intravenous Q12H      LOS: 2 days   RAI,RIPUDEEP M.D. [facility] 09/29/2013, 2:57 PM Pager: [phone]  If 7PM-7AM, please contact night-coverage [email or web address] Password [email or web address]

## 2013-09-29 NOTE — Progress Notes (Signed)
  Echocardiogram 2D Echocardiogram has been performed.  Candie Gintz 09/29/2013, 10:30 AM

## 2013-09-30 ENCOUNTER — Encounter (HOSPITAL_COMMUNITY): Payer: Self-pay | Admitting: *Deleted

## 2013-09-30 LAB — CULTURE, GROUP A STREP

## 2013-09-30 MED ORDER — ASPIRIN 325 MG PO TBEC: 325.0000 mg | DELAYED_RELEASE_TABLET | Freq: Every day | ORAL | Status: DC

## 2013-09-30 MED ORDER — ALBUTEROL SULFATE HFA 108 (90 BASE) MCG/ACT IN AERS
2.0000 | INHALATION_SPRAY | Freq: Once | RESPIRATORY_TRACT | Status: DC
Start: 2013-09-30 — End: 2013-09-30

## 2013-09-30 MED ORDER — BUDESONIDE-FORMOTEROL FUMARATE 80-4.5 MCG/ACT IN AERO: 2.0000 | INHALATION_SPRAY | Freq: Two times a day (BID) | RESPIRATORY_TRACT | Status: DC

## 2013-09-30 MED ORDER — PREDNISONE 10 MG PO TABS: ORAL_TABLET | ORAL | Status: DC

## 2013-09-30 MED ORDER — ATORVASTATIN CALCIUM 20 MG PO TABS: 20.0000 mg | ORAL_TABLET | Freq: Every day | ORAL | Status: AC

## 2013-09-30 MED ORDER — ALBUTEROL SULFATE (2.5 MG/3ML) 0.083% IN NEBU
2.5000 mg | INHALATION_SOLUTION | Freq: Once | RESPIRATORY_TRACT | Status: DC
  Filled 2013-09-30: qty 3

## 2013-09-30 MED ORDER — ASPIRIN EC 81 MG PO TBEC: 81.0000 mg | DELAYED_RELEASE_TABLET | Freq: Every day | ORAL | Status: AC

## 2013-09-30 MED ORDER — ALBUTEROL SULFATE HFA 108 (90 BASE) MCG/ACT IN AERS
2.0000 | INHALATION_SPRAY | Freq: Once | RESPIRATORY_TRACT | Status: AC
  Administered 2013-09-30: 2 via RESPIRATORY_TRACT
  Filled 2013-09-30: qty 6.7

## 2013-09-30 MED ORDER — AZITHROMYCIN 500 MG PO TABS
500.0000 mg | ORAL_TABLET | Freq: Once | ORAL | Status: AC
  Administered 2013-09-30: 500 mg via ORAL
  Filled 2013-09-30: qty 1

## 2013-09-30 MED ORDER — BENZONATATE 100 MG PO CAPS: 100.0000 mg | ORAL_CAPSULE | Freq: Three times a day (TID) | ORAL | Status: DC

## 2013-09-30 MED ORDER — ALBUTEROL SULFATE HFA 108 (90 BASE) MCG/ACT IN AERS: 2.0000 | INHALATION_SPRAY | Freq: Four times a day (QID) | RESPIRATORY_TRACT | Status: AC | PRN

## 2013-09-30 MED ORDER — LEVOTHYROXINE SODIUM 50 MCG PO TABS: 50.0000 ug | ORAL_TABLET | Freq: Every day | ORAL | Status: AC

## 2013-09-30 MED ORDER — AMLODIPINE BESYLATE 5 MG PO TABS
5.0000 mg | ORAL_TABLET | Freq: Every day | ORAL | Status: AC
Start: 2013-09-30 — End: ?

## 2013-09-30 MED ORDER — PREDNISONE 50 MG PO TABS
60.0000 mg | ORAL_TABLET | Freq: Once | ORAL | Status: AC
  Administered 2013-09-30: 60 mg via ORAL
  Filled 2013-09-30: qty 1

## 2013-09-30 MED ORDER — AZITHROMYCIN 500 MG PO TABS: 500.0000 mg | ORAL_TABLET | Freq: Every day | ORAL | Status: DC

## 2013-09-30 MED ORDER — PANTOPRAZOLE SODIUM 40 MG PO TBEC: 40.0000 mg | DELAYED_RELEASE_TABLET | Freq: Every day | ORAL | Status: DC

## 2013-09-30 NOTE — ED Provider Notes (Signed)
Medical screening examination/treatment/procedure(s) were conducted as a shared visit with non-physician practitioner(s) and myself.  I personally evaluated the patient during the encounter.  EKG in MUSE  CP off and on, now with LOC of unclear etiology. Doubt PE, no dissection on CTA. Admit for further eval.   Partick Musselman B. Bernette MayersSheldon, MD 09/30/13 [phone]

## 2013-09-30 NOTE — Progress Notes (Addendum)
       Patient Name: Darryl Green Date of Encounter: 09/30/2013   SUBJECTIVE: No chest pain and breathing better.  TELEMETRY:  NSR: Filed Vitals:   09/29/13 1423 09/29/13 2030 09/29/13 2134 09/30/13 0528  BP: 143/67  154/70 139/83  Pulse: 113  117 95  Temp: 98.1 F (36.7 C)  97.8 F (36.6 C) 97.4 F (36.3 C)  TempSrc: Oral  Oral Oral  Resp: 17  20 20   Height:      Weight:      SpO2: 98% 98% 95% 93%    Intake/Output Summary (Last 24 hours) at 09/30/13 0811 Last data filed at 09/29/13 2250  Gross per 24 hour  Intake   1393 ml  Output      0 ml  Net   1393 ml    LABS: Basic Metabolic Panel:  Recent Labs  16/05/9601/25/15 0336 09/29/13 0342  NA 139 136*  K 3.9 3.9  CL 101 94*  CO2 23 23  GLUCOSE 187* 235*  BUN 18 13  CREATININE 0.99 0.98  CALCIUM 9.1 9.5   CBC:  Recent Labs  09/28/13 0336 09/29/13 0342  WBC 11.7* 12.0*  HGB 14.3 14.7  HCT 41.5 42.4  MCV 96.5 95.5  PLT 262 294   Cardiac Enzymes:  Recent Labs  09/27/13 2245 09/28/13 0336 09/28/13 0920  TROPONINI <0.30 <0.30 <0.30   BNP: No components found with this basename: POCBNP,  Hemoglobin A1C:  Recent Labs  09/27/13 2147  HGBA1C 6.1*   Fasting Lipid Panel:  Recent Labs  09/27/13 2147  CHOL 302*  HDL 33*  LDLCALC UNABLE TO CALCULATE IF TRIGLYCERIDE OVER 400 mg/dL  TRIG [id number]*  CHOLHDL 9.2   BNP (last 3 results)  Recent Labs  09/27/13 2147  PROBNP 17.4    Radiology/Studies:  No new data  Physical Exam: Blood pressure 139/83, pulse 95, temperature 97.4 F (36.3 C), temperature source Oral, resp. rate 20, height 6\' 4"  (1.93 m), weight 332 lb 13.9 oz (150.987 kg), SpO2 93.00%. Weight change:    Chest is clear No pericardial rub  ASSESSMENT:  1. Chest pain with no evidence MI or ischemia. Has positive risk factors. 2. No evidence for CHF. Has diastolic dysfunction on echo  Plan:  1. Aspirin 81 mg daily. 2. If recurrent CP as OP, please schedule  ETT.  Selinda EonSigned, Clydene Burack III,Zinedine Ellner W 09/30/2013, 8:11 AM

## 2013-09-30 NOTE — Discharge Summary (Signed)
Physician Discharge Summary  Patient ID: Darryl Green MRN: [id number] DOB/AGE: 08/18/1970 43 y.o.  Admit date: 09/27/2013 Discharge date: 09/30/2013  Primary Care Physician: PCP in [place], patient does not remember the name  Discharge Diagnoses:    . atypical Chest pain . Acute bronchitis resolved  . Other and unspecified hyperlipidemia . Unspecified hypothyroidism . Morbid obesity . Essential hypertension, benign    Nicotine abuse  Consults: Cardiology, Dr. Tamala Julian                   Endocrinology, Dr. Dwyane Dee via phone consultation   Recommendations for Outpatient Follow-up:  Please check thyroid labs in 4 weeks, Synthroid dose was increased  Allergies:   Allergies  Allergen Reactions  . Sulfa Antibiotics Other (See Comments)    Childhood reaction (throat swelled)     Discharge Medications:   Medication List         albuterol 108 (90 BASE) MCG/ACT inhaler  Commonly known as:  PROVENTIL HFA;VENTOLIN HFA  Inhale 2 puffs into the lungs every 6 (six) hours as needed for wheezing or shortness of breath.     amLODipine 5 MG tablet  Commonly known as:  NORVASC  Take 1 tablet (5 mg total) by mouth daily.     aspirin EC 81 MG tablet  Take 1 tablet (81 mg total) by mouth daily.     atorvastatin 20 MG tablet  Commonly known as:  LIPITOR  Take 1 tablet (20 mg total) by mouth at bedtime.     azithromycin 500 MG tablet  Commonly known as:  ZITHROMAX  Take 1 tablet (500 mg total) by mouth daily. X 3 more days  Start taking on:  10/01/2013     benzonatate 100 MG capsule  Commonly known as:  TESSALON  Take 1 capsule (100 mg total) by mouth 3 (three) times daily.     budesonide-formoterol 80-4.5 MCG/ACT inhaler  Commonly known as:  SYMBICORT  Inhale 2 puffs into the lungs 2 (two) times daily.     levothyroxine 50 MCG tablet  Commonly known as:  SYNTHROID, LEVOTHROID  Take 1 tablet (50 mcg total) by mouth daily before breakfast.     pantoprazole 40 MG tablet   Commonly known as:  PROTONIX  Take 1 tablet (40 mg total) by mouth daily.     predniSONE 10 MG tablet  Commonly known as:  DELTASONE  - Prednisone dosing: Take  Prednisone 63m (4 tabs) x 3 days, then taper to 326m(3 tabs) x 3 days, then 2061m2 tabs) x 3days, then 12m30m tab) x 3days, then OFF.  -   - Dispense:  30 tabs, refills: None         Brief H and P: For complete details please refer to admission H and P, but in brief Darryl Green 43 y58. male.  Patient has little known past medical history. 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 He thinks his TSH was 27 although he does not know this test by name. He started levothyroxine 25 mcg. Does have ongoing tobacco abuse one half pack per day. OSA not on positive pressure.  Patient has had upper respiratory symptoms including rhinorrhea, nasal drainage, cough productive of intermittent green sputum. He developed several episodes of sharp pain in the left side of his chest and between his shoulder blades in the back. Episodes last about 15-20 minutes and were not provoked by anything identifiable nor relieved consistently by anything.  It is consistently worsened by deep inspiration so the patient says. Around noon on the day of admission, the patient became dizzy and after a few moments passed out lost consciousness. Was found by his wife who estimated that he was out for 3 or 4 minutes, but she was not sure about the specific duration.  Patient does report fatigue. Denied any fevers or chills. No known sick contacts. Denied bowel or bladder changes. Does report intermittent wheezing which was unusual for him; reports no history of COPD or asthma although he does think he was diagnosed with bronchitis sometime ago. Patient is not aware of any history of cardiovascular disease but his mother did have coronary disease in her 106s. Patient denied history of hyperlipidemia. He did report  fatigue and weight gain. Did have a period where he felt as if his thyroid gland was tender but this is resolving.   Hospital Course:   Acute bronchitis: Improved patient was placed on scheduled nebulizer breathing treatments, Symbicort, Zithromax and IV Solu-Medrol.  Influenza panel  was negative, strep throat was  negative, RSV panel  was negative. Patient was placed on oral prednisone with taper, Zithromax, Ventolin inhaler. He was also given prescription for Protonix for GI prophylaxis.   Chest pain: Likely pleuritic, anyways would not be able to tolerate stress test at this time  inpatient due to acute bronchitis. He was ruled out for acute ACS. 2d echo showed EF of 35-46%, grade 1 diastolic dysfunction no regional wall motion abnormalities. Cardiology was consulted and recommended to continue aspirin, treat risk factors. Since patient's symptoms have significant improved at time of discharge, he was recommended outpatient stress test if the chest pain recurs.    Other and unspecified hyperlipidemia - Lipid panel showed cholesterol 302 triglycerides 443. Patient was started on statins. He was counseled on diet and weight control.   Unspecified hypothyroidism.  - Patient did have tenderness on the left side of the neck, thyroid ultrasound done, suggestive of possibly thyroiditis  - ESR, CRP with in normal range, thyroglobulin antibodies 14879,  thyroid peroxidase antibody pending - TSH 16.285, free T4 0.8, T3 low at 75.2. discussed in detail with Dr. Elayne Snare on 2/25, endocrinology, recommended increasing Synthroid to 50 MCG daily. I made an appointment with Dr. Dwyane Dee on 10/17/13 on [facility] for follow up   Morbid obesity  - Patient strongly counseled on diet and weight control   Essential hypertension, benign  Currently stable, continue Norvasc    Day of Discharge BP 139/83  Pulse 95  Temp(Src) 97.4 F (36.3 C) (Oral)  Resp 20  Ht $R'6\' 4"'Kr$  (1.93 m)  Wt 150.987 kg (332 lb 13.9 oz)  BMI  40.53 kg/m2  SpO2 93%  Physical Exam: General: Alert and awake oriented x3 not in any acute distress. CVS: S1-S2 clear no murmur rubs or gallops Chest: clear to auscultation bilaterally, no wheezing rales or rhonchi Abdomen: obese soft nontender, nondistended, normal bowel sounds Extremities: no cyanosis, clubbing or edema noted bilaterally Neuro: Cranial nerves II-XII intact, no focal neurological deficits   The results of significant diagnostics from this hospitalization (including imaging, microbiology, ancillary and laboratory) are listed below for reference.    LAB RESULTS: Basic Metabolic Panel:  Recent Labs Lab 09/28/13 0336 09/29/13 0342  NA 139 136*  K 3.9 3.9  CL 101 94*  CO2 23 23  GLUCOSE 187* 235*  BUN 18 13  CREATININE 0.99 0.98  CALCIUM 9.1 9.5   Liver Function Tests: No results found  for this basename: AST, ALT, ALKPHOS, BILITOT, PROT, ALBUMIN,  in the last 168 hours No results found for this basename: LIPASE, AMYLASE,  in the last 168 hours No results found for this basename: AMMONIA,  in the last 168 hours CBC:  Recent Labs Lab 09/28/13 0336 09/29/13 0342  WBC 11.7* 12.0*  HGB 14.3 14.7  HCT 41.5 42.4  MCV 96.5 95.5  PLT 262 294   Cardiac Enzymes:  Recent Labs Lab 09/28/13 0336 09/28/13 0920  TROPONINI <0.30 <0.30   BNP: No components found with this basename: POCBNP,  CBG: No results found for this basename: GLUCAP,  in the last 168 hours  Significant Diagnostic Studies:  X-ray Chest Pa And Lateral   09/27/2013   CLINICAL DATA:  Cough, fever and shortness of breath.  EXAM: CHEST  2 VIEW  COMPARISON:  08/27/2013  FINDINGS: The cardiomediastinal silhouette is unremarkable.  Mild peribronchial thickening again noted.  There is no evidence of focal airspace disease, pulmonary edema, suspicious pulmonary nodule/mass, pleural effusion, or pneumothorax. No acute bony abnormalities are identified.  Remote left rib fractures are again noted.   IMPRESSION: No evidence of acute cardiopulmonary disease.   Electronically Signed   By: Hassan Rowan M.D.   On: 09/27/2013 23:53   US Soft Tissue Head/neck  09/28/2013   CLINICAL DATA:  Thyroid disease.  EXAM: THYROID ULTRASOUND  TECHNIQUE: Ultrasound examination of the thyroid gland and adjacent soft tissues was performed.  COMPARISON:  CT ANGIO CHEST AORTA W/CM & WO/CM dated 09/27/2013  FINDINGS: Right thyroid lobe  Measurements: 5.6 x 3.4 x 2.0 cm. No nodules visualized. Heterogeneous echotexture.  Left thyroid lobe  Measurements: By 0.9 x 3.4 x 2.5 cm. No nodules visualized. Heterogeneous echotexture.  Isthmus  Thickness: 0.8 cm.  No nodules visualized.  Lymph nodes  1.4 x 0.4 x 1.1 cm right cervical lymph node.  IMPRESSION: Thyroid gland is prominent with heterogeneous echotexture noted diffusely. No focal mass lesion. These findings suggest thyroiditis.   Electronically Signed   By: Marcello Moores  Register   On: 09/28/2013 14:32   Ct Angio Chest Aortic Dissect W &/or W/o  09/27/2013   CLINICAL DATA:  Chest pain. Left-sided neck and abdominal pain. Syncopal episode.  EXAM: CT ANGIOGRAPHY CHEST, ABDOMEN AND PELVIS  TECHNIQUE: Multidetector CT imaging through the chest, abdomen and pelvis was performed using the standard protocol during bolus administration of intravenous contrast. Multiplanar reconstructed images and MIPs were obtained and reviewed to evaluate the vascular anatomy.  CONTRAST:  15m OMNIPAQUE IOHEXOL 350 MG/ML SOLN  COMPARISON:  DG CHEST PORTABLE dated 08/27/2013  FINDINGS: CTA CHEST FINDINGS  Lungs/Pleura: Mild motion degradation. Suspect mild centrilobular emphysema. Subpleural lymph node at 3 mm along the right minor fissure on image 56/series 6. A lingular calcified granuloma on image 59/series 6.  No pleural fluid.  Minimal bilateral pleural thickening.  Heart/Mediastinum: No intramural hematoma on unenhanced images. No supraclavicular adenopathy. Mild soft tissue fullness of the thyroid gland,  greater on the left. No well-defined focal lesion.  Normal aortic caliber without dissection. No mediastinal adenopathy. Calcified left hilar 1.5 cm node on image 57/series 5. Mildly prominent right hilar and infrahilar nodal tissue which is likely reactive.  Review of the MIP images confirms the above findings.  CTA ABDOMEN AND PELVIS FINDINGS  Abdomen/Pelvis: Mild hepatic steatosis. Normal spleen, stomach, pancreas, gallbladder, biliary tract, adrenal glands, right kidney. Too small to characterize lower pole left renal lesion on image 189/series 5.  Normal caliber of the abdominal aorta,  without evidence of dissection. There are 2 right renal arteries. The inferior mesenteric artery is patent. No retroperitoneal or retrocrural adenopathy.  Normal colon, appendix, and terminal ileum. Normal small bowel without abdominal ascites. A fat containing left inguinal hernia. No pelvic adenopathy. The iliac vasculature is patent, without dissection or significant stenosis. Normal urinary bladder and prostate. No significant free fluid.  Bones/Musculoskeletal: Scattered pelvic bone islands. Remote left rib fractures.  Review of the MIP images confirms the above findings.  IMPRESSION: 1. No evidence above aortic dissection or aneurysm. 2. No other explanation for chest pain. 3. Old granulomatous disease in the left side of the chest. 4. Mild hepatic steatosis.   Electronically Signed   By: Abigail Miyamoto M.D.   On: 09/27/2013 17:33   Ct Cta Abd/pel W/cm &/or W/o Cm  09/27/2013   CLINICAL DATA:  Chest pain. Left-sided neck and abdominal pain. Syncopal episode.  EXAM: CT ANGIOGRAPHY CHEST, ABDOMEN AND PELVIS  TECHNIQUE: Multidetector CT imaging through the chest, abdomen and pelvis was performed using the standard protocol during bolus administration of intravenous contrast. Multiplanar reconstructed images and MIPs were obtained and reviewed to evaluate the vascular anatomy.  CONTRAST:  11m OMNIPAQUE IOHEXOL 350 MG/ML SOLN   COMPARISON:  DG CHEST PORTABLE dated 08/27/2013  FINDINGS: CTA CHEST FINDINGS  Lungs/Pleura: Mild motion degradation. Suspect mild centrilobular emphysema. Subpleural lymph node at 3 mm along the right minor fissure on image 56/series 6. A lingular calcified granuloma on image 59/series 6.  No pleural fluid.  Minimal bilateral pleural thickening.  Heart/Mediastinum: No intramural hematoma on unenhanced images. No supraclavicular adenopathy. Mild soft tissue fullness of the thyroid gland, greater on the left. No well-defined focal lesion.  Normal aortic caliber without dissection. No mediastinal adenopathy. Calcified left hilar 1.5 cm node on image 57/series 5. Mildly prominent right hilar and infrahilar nodal tissue which is likely reactive.  Review of the MIP images confirms the above findings.  CTA ABDOMEN AND PELVIS FINDINGS  Abdomen/Pelvis: Mild hepatic steatosis. Normal spleen, stomach, pancreas, gallbladder, biliary tract, adrenal glands, right kidney. Too small to characterize lower pole left renal lesion on image 189/series 5.  Normal caliber of the abdominal aorta, without evidence of dissection. There are 2 right renal arteries. The inferior mesenteric artery is patent. No retroperitoneal or retrocrural adenopathy.  Normal colon, appendix, and terminal ileum. Normal small bowel without abdominal ascites. A fat containing left inguinal hernia. No pelvic adenopathy. The iliac vasculature is patent, without dissection or significant stenosis. Normal urinary bladder and prostate. No significant free fluid.  Bones/Musculoskeletal: Scattered pelvic bone islands. Remote left rib fractures.  Review of the MIP images confirms the above findings.  IMPRESSION: 1. No evidence above aortic dissection or aneurysm. 2. No other explanation for chest pain. 3. Old granulomatous disease in the left side of the chest. 4. Mild hepatic steatosis.   Electronically Signed   By: KAbigail MiyamotoM.D.   On: 09/27/2013 17:33    2D  ECHO: Study Conclusions  - Left ventricle: The cavity size was normal. Wall thickness was increased in a pattern of moderate LVH. Systolic function was vigorous. The estimated ejection fraction was in the range of 65% to 70%. Wall motion was normal; there were no regional wall motion abnormalities. Doppler parameters are consistent with abnormal left ventricular relaxation (grade 1 diastolic dysfunction). - Right atrium: The atrium was mildly dilated.    Disposition and Follow-up: Discharge Orders   Future Appointments Provider Department Dept Phone   10/17/2013 3:45 PM  Elayne Snare, MD [facility] [phone]   Future Orders Complete By Expires   Diet - low sodium heart healthy  As directed    Discharge instructions  As directed    Scheduling Instructions:     1) Please rinse mouth with water and spit after using the Symbicort inhaler  2) Please see primary care physician within next 10 days for hospital followup. If you have any recurrent chest pain, please have your primary care physician schedule an outpatient stress test.  3) You will need to have your thyroid labs checked in 4 weeks, please see Dr. Dwyane Dee per your appointment   Increase activity slowly  As directed        DISPOSITION: Home   DIET: Heart healthy diet    DISCHARGE FOLLOW-UP Follow-up Information   Follow up with [facility], MD On 10/17/2013. (at 3:45PM)    Specialty:  Endocrinology   Contact information:   [place] [address] [phone]       Schedule an appointment as soon as possible for a visit in 10 days to follow up. (for hospital follow-up)    Contact information:   PCP      Time spent on Discharge: 45 mins  Signed:   Maidie Streight M.D. [facility] 09/30/2013, 11:14 AM Pager: [phone]

## 2013-10-17 ENCOUNTER — Ambulatory Visit: Payer: PRIVATE HEALTH INSURANCE | Admitting: Endocrinology

## 2013-10-31 ENCOUNTER — Ambulatory Visit: Payer: PRIVATE HEALTH INSURANCE | Admitting: Endocrinology

## 2013-11-03 ENCOUNTER — Ambulatory Visit (INDEPENDENT_AMBULATORY_CARE_PROVIDER_SITE_OTHER): Payer: PRIVATE HEALTH INSURANCE | Admitting: Endocrinology

## 2013-11-03 ENCOUNTER — Encounter: Payer: Self-pay | Admitting: Endocrinology

## 2013-11-03 VITALS — BP 124/76 | HR 78 | Temp 98.3°F | Resp 16 | Ht 74.5 in | Wt 333.0 lb

## 2013-11-03 DIAGNOSIS — R7309 Other abnormal glucose: Secondary | ICD-10-CM

## 2013-11-03 DIAGNOSIS — G4733 Obstructive sleep apnea (adult) (pediatric): Secondary | ICD-10-CM | POA: Insufficient documentation

## 2013-11-03 DIAGNOSIS — E039 Hypothyroidism, unspecified: Secondary | ICD-10-CM

## 2013-11-03 DIAGNOSIS — E049 Nontoxic goiter, unspecified: Secondary | ICD-10-CM

## 2013-11-03 DIAGNOSIS — R739 Hyperglycemia, unspecified: Secondary | ICD-10-CM

## 2013-11-03 LAB — T4, FREE: FREE T4: 0.83 ng/dL (ref 0.60–1.60)

## 2013-11-03 LAB — BASIC METABOLIC PANEL
BUN: 13 mg/dL (ref 6–23)
CHLORIDE: 104 meq/L (ref 96–112)
CO2: 24 meq/L (ref 19–32)
Calcium: 9 mg/dL (ref 8.4–10.5)
Creatinine, Ser: 1 mg/dL (ref 0.4–1.5)
GFR: 84.9 mL/min (ref >60.00)
Glucose, Bld: 88 mg/dL (ref 70–99)
Potassium: 3.9 mEq/L (ref 3.5–5.1)
Sodium: 137 mEq/L (ref 135–145)

## 2013-11-03 LAB — HEMOGLOBIN A1C: HEMOGLOBIN A1C: 6.6 % — AB (ref 4.6–6.5)

## 2013-11-03 LAB — TSH: TSH: 0.97 u[IU]/mL (ref 0.35–5.50)

## 2013-11-03 NOTE — Patient Instructions (Signed)
Dose to be decided

## 2013-11-03 NOTE — Progress Notes (Signed)
Patient ID: Darryl Green, male   DOB: Apr 25, 1971, 43 y.o.   MRN: [id number]    Reason for Appointment:  Hypothyroidism, new visit   History of Present Illness:   The Hyothyroidism was first diagnosed in 1/15 At that time he was getting a work physical and was found to have a thyroid enlargement on the left side He was also having some soreness on the left side of his neck He had also had problems with significant amount of weight gain starting from late 2014; he thinks he had gained about 50 pounds Also had symptoms of swelling of his legs, some puffiness of his hands and face He did feel more tired than usual but did not have any cold intolerance, dry skin or constipation  He was evaluated at [facility] and saw his primary physician also Details are not available but he was started on levothyroxine 25 mcg dailyby PCP  In 2[facility]15 [facility] and during this admission and was felt to have tenderness of his left lobe of the thyroid. The case was discussed with the hospital physician. However evaluation showed normal ESR and ultrasound was unremarkable except for heterogenous changes At discharge his dose was increased to 50 mcg With this he has been feeling less tired and has not had swelling of his legs      Lab Results  Component Value Date   TSH 16.285* 09/27/2013   FREET4 0.80 09/27/2013      Past Medical History  Diagnosis Date  . Thyroid disease   . Essential hypertension, benign 09/28/2013    No past surgical history on file.  No family history on file.  Social History:  reports that he has been smoking.  He does not have any smokeless tobacco history on file. He reports that he does not drink alcohol or use illicit drugs.  Allergies:  Allergies  Allergen Reactions  . Sulfa Antibiotics Other (See Comments)    Childhood reaction (throat swelled)      Medication List       This list is accurate as of: 11/03/13  3:39 PM.  Always use  your most recent med list.               albuterol 108 (90 BASE) MCG/ACT inhaler  Commonly known as:  PROVENTIL HFA;VENTOLIN HFA  Inhale 2 puffs into the lungs every 6 (six) hours as needed for wheezing or shortness of breath.     amLODipine 5 MG tablet  Commonly known as:  NORVASC  Take 1 tablet (5 mg total) by mouth daily.     aspirin EC 81 MG tablet  Take 1 tablet (81 mg total) by mouth daily.     atorvastatin 20 MG tablet  Commonly known as:  LIPITOR  Take 1 tablet (20 mg total) by mouth at bedtime.     azithromycin 500 MG tablet  Commonly known as:  ZITHROMAX  Take 1 tablet (500 mg total) by mouth daily. X 3 more days     benzonatate 100 MG capsule  Commonly known as:  TESSALON  Take 1 capsule (100 mg total) by mouth 3 (three) times daily.     budesonide-formoterol 80-4.5 MCG/ACT inhaler  Commonly known as:  SYMBICORT  Inhale 2 puffs into the lungs 2 (two) times daily.     levothyroxine 50 MCG tablet  Commonly known as:  SYNTHROID, LEVOTHROID  Take 1 tablet (50 mcg total) by mouth daily before breakfast.  pantoprazole 40 MG tablet  Commonly known as:  PROTONIX  Take 1 tablet (40 mg total) by mouth daily.     predniSONE 10 MG tablet  Commonly known as:  DELTASONE  - Prednisone dosing: Take  Prednisone 24m (4 tabs) x 3 days, then taper to 341m(3 tabs) x 3 days, then 2083m2 tabs) x 3days, then 35m38m tab) x 3days, then OFF.  -   - Dispense:  30 tabs, refills: None        Review of Systems:  CARDIOLOGY: has mild history of high blood pressure. currently taking Norvasc 5 mg daily           GASTROENTEROLOGY:  no Change in bowel habits.      ENDOCRINOLOGY:no history of Diabetes. however during hospitalization his glucose was as high as 235. A1c was 6.1% Has not had any followup of this  Edema: Had this earlier this year and now much better, mild in the evenings only Has history of snoring and obstructive sleep apnea, awaiting CPAP No history of chest  pain on exertion He had acute bronchitis in the hospital treated with steroids and nebulizers, currently asymptomatic     Lipids: Apparently was found to have high values in 2/15 and started on Lipitor by PCP  No history of numbness or tingling in hands or feet   Examination:    BP 124/76  Pulse 78  Temp(Src) 98.3 F (36.8 C)  Resp 16  Ht 6' 2.5" (1.892 m)  Wt 333 lb (151.048 kg)  BMI 42.20 kg/m2  SpO2 98%   General Appearance: pleasant, large build, a generalized obesity present. Face not puffy    ENT: Normal voice, no hoarseness. Normal tongue.      Eyes: No proptosis or eyelid swelling.          Neck: The thyroid is enlarged about 2 times normal, smooth, firm, low lying, non-tender and diffuse. There is no lymphadenopathy .    Cardiovascular: Normal apex and heart sounds, no murmur Respiratory:  Lungs clear Gastrointestinal: abdomen soft, no hepatosplenomegaly     Neurological: REFLEXES: at biceps are difficult to elicit Skin:warm, no rashes        Assessments:   1.Hypothyroidism, primary of recent onset. Although he appears to have had significant symptoms of weight gain, some generalized edema and fatigue he has had significant improvement in subjective symptoms with only 50 mcg of levothyroxine Does have small goiter suggestive of Hashimoto's thyroiditis. May have had some autoimmune inflammatory reaction rather than subacute thyroiditis a couple of months ago causing local tenderness Also has significant family history of thyroid disease  2. Obesity and recent weight gain with family history of diabetes.  Plan:    Check thyroid levels today and adjust levothyroxine dose accordingly. If changed will need to followup in 2 months  Screen for diabetes with glucose and A1c  Continue followup with primary care physician for other problems   [facility]/2015, 3:39 PM   Addendum: TSH normal, A1c 6.6 indicating at least prediabetes. Needs advice on meal planning  and weight loss  Office Visit on 11/03/2013  Component Date Value Ref Range Status  . Hemoglobin A1C 11/03/2013 6.6* 4.6 - 6.5 % Final   Glycemic Control Guidelines for People with Diabetes:Non Diabetic:  <6%Goal of Therapy: <7%Additional Action Suggested:  >8%   . Sodium 11/03/2013 137  135 - 145 mEq/L Final  . Potassium 11/03/2013 3.9  3.5 - 5.1 mEq/L Final  . Chloride 11/03/2013 104  96 - 112 mEq/L Final  . CO2 11/03/2013 24  19 - 32 mEq/L Final  . Glucose, Bld 11/03/2013 88  70 - 99 mg/dL Final  . BUN 11/03/2013 13  6 - 23 mg/dL Final  . Creatinine, Ser 11/03/2013 1.0  0.4 - 1.5 mg/dL Final  . Calcium 11/03/2013 9.0  8.4 - 10.5 mg/dL Final  . GFR 11/03/2013 84.90  >60.00 mL/min Final  . TSH 11/03/2013 0.97  0.35 - 5.50 uIU/mL Final  . Free T4 11/03/2013 0.83  0.60 - 1.60 ng/dL Final

## 2013-11-04 NOTE — Progress Notes (Signed)
Quick Note:  Please let patient know thyroid ok, no change; has mild Diabetes now, will schedule with diabetes educator and dietician; see me in 2 weeks for discussion ______

## 2013-12-28 ENCOUNTER — Other Ambulatory Visit: Payer: PRIVATE HEALTH INSURANCE

## 2014-01-02 ENCOUNTER — Ambulatory Visit: Payer: PRIVATE HEALTH INSURANCE | Admitting: Endocrinology

## 2014-01-02 DIAGNOSIS — Z0289 Encounter for other administrative examinations: Secondary | ICD-10-CM

## 2015-03-02 IMAGING — CR DG CHEST 2V
2 series · 2 of 2 positions shown · non-contrast
Comparison: 08/27/2013

CLINICAL DATA: Cough, fever and shortness of breath.

EXAM:
CHEST  2 VIEW

[w chest pa]
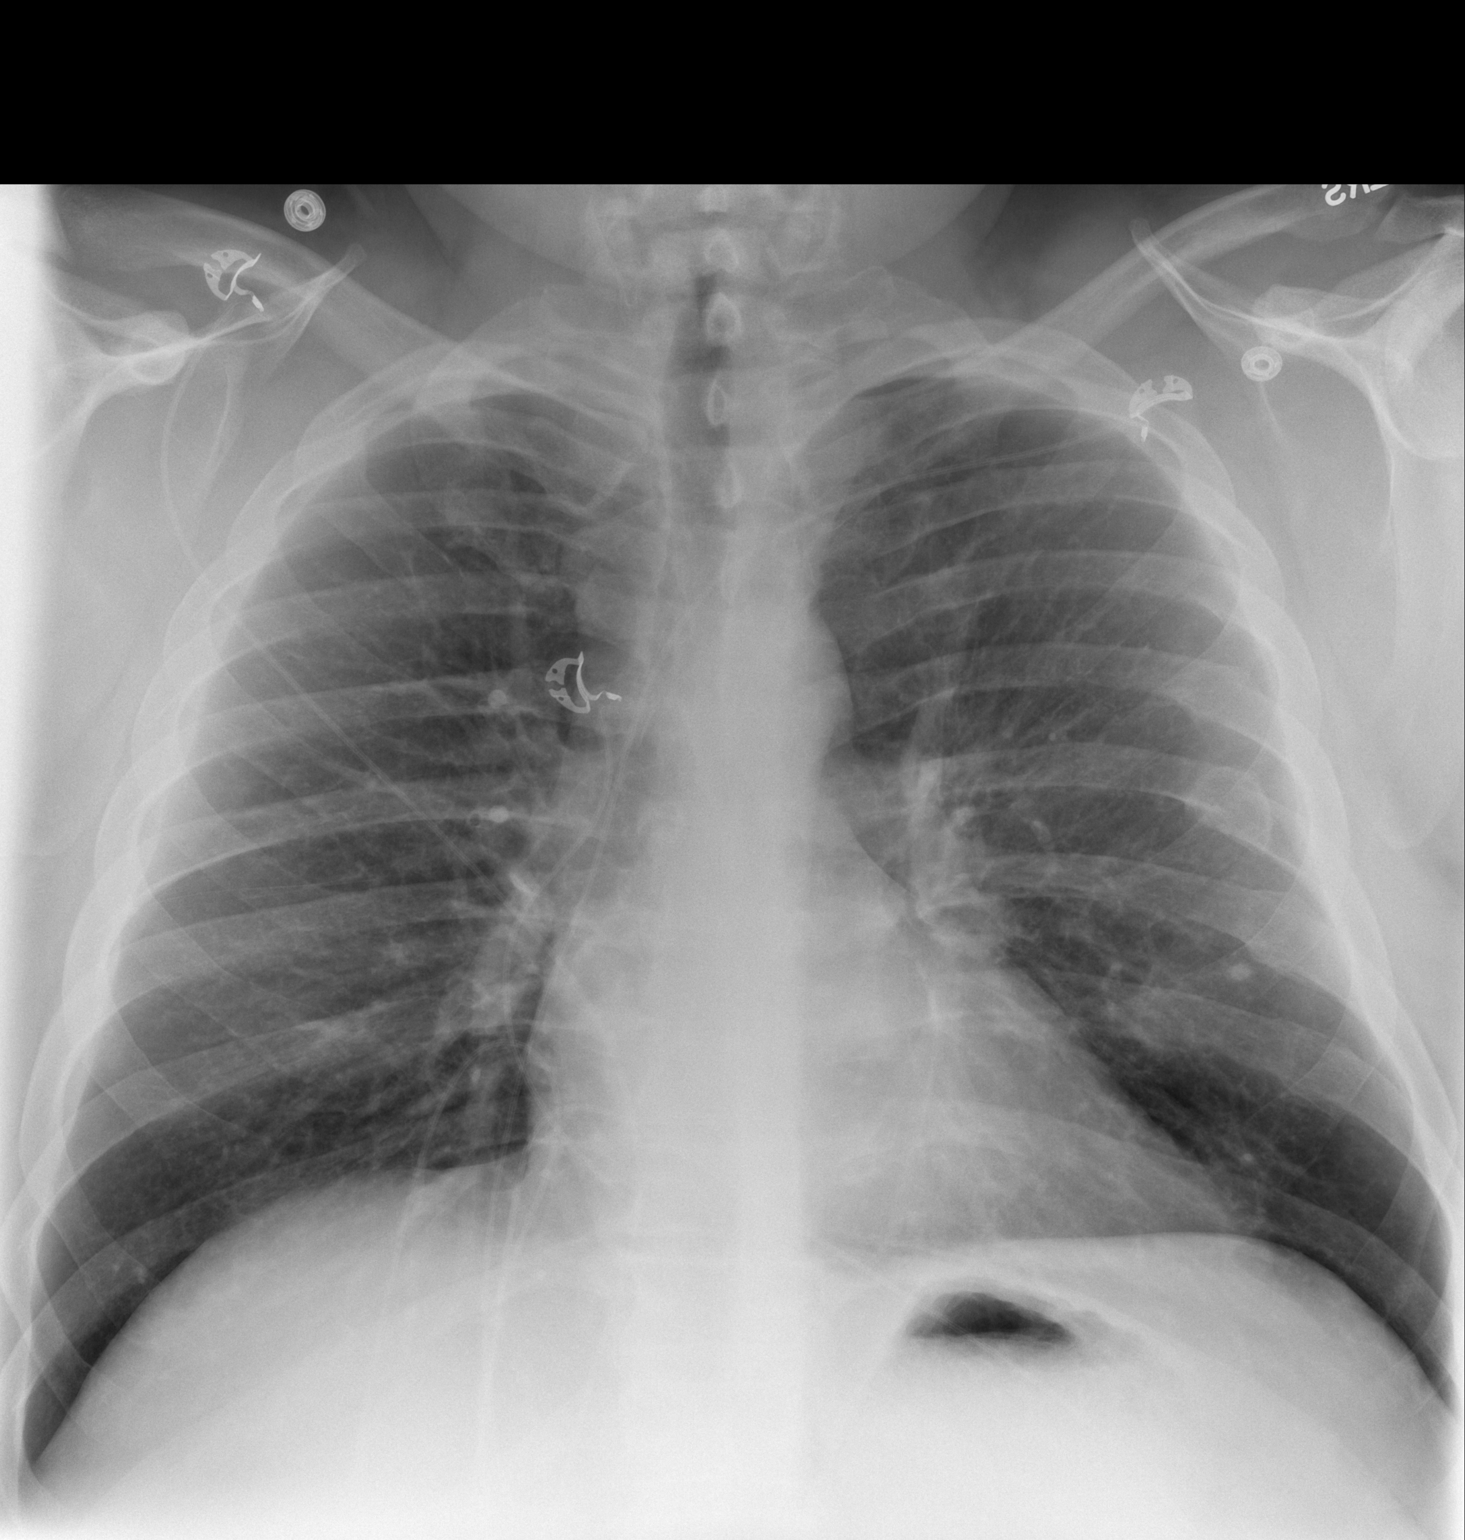

[w chest lat]
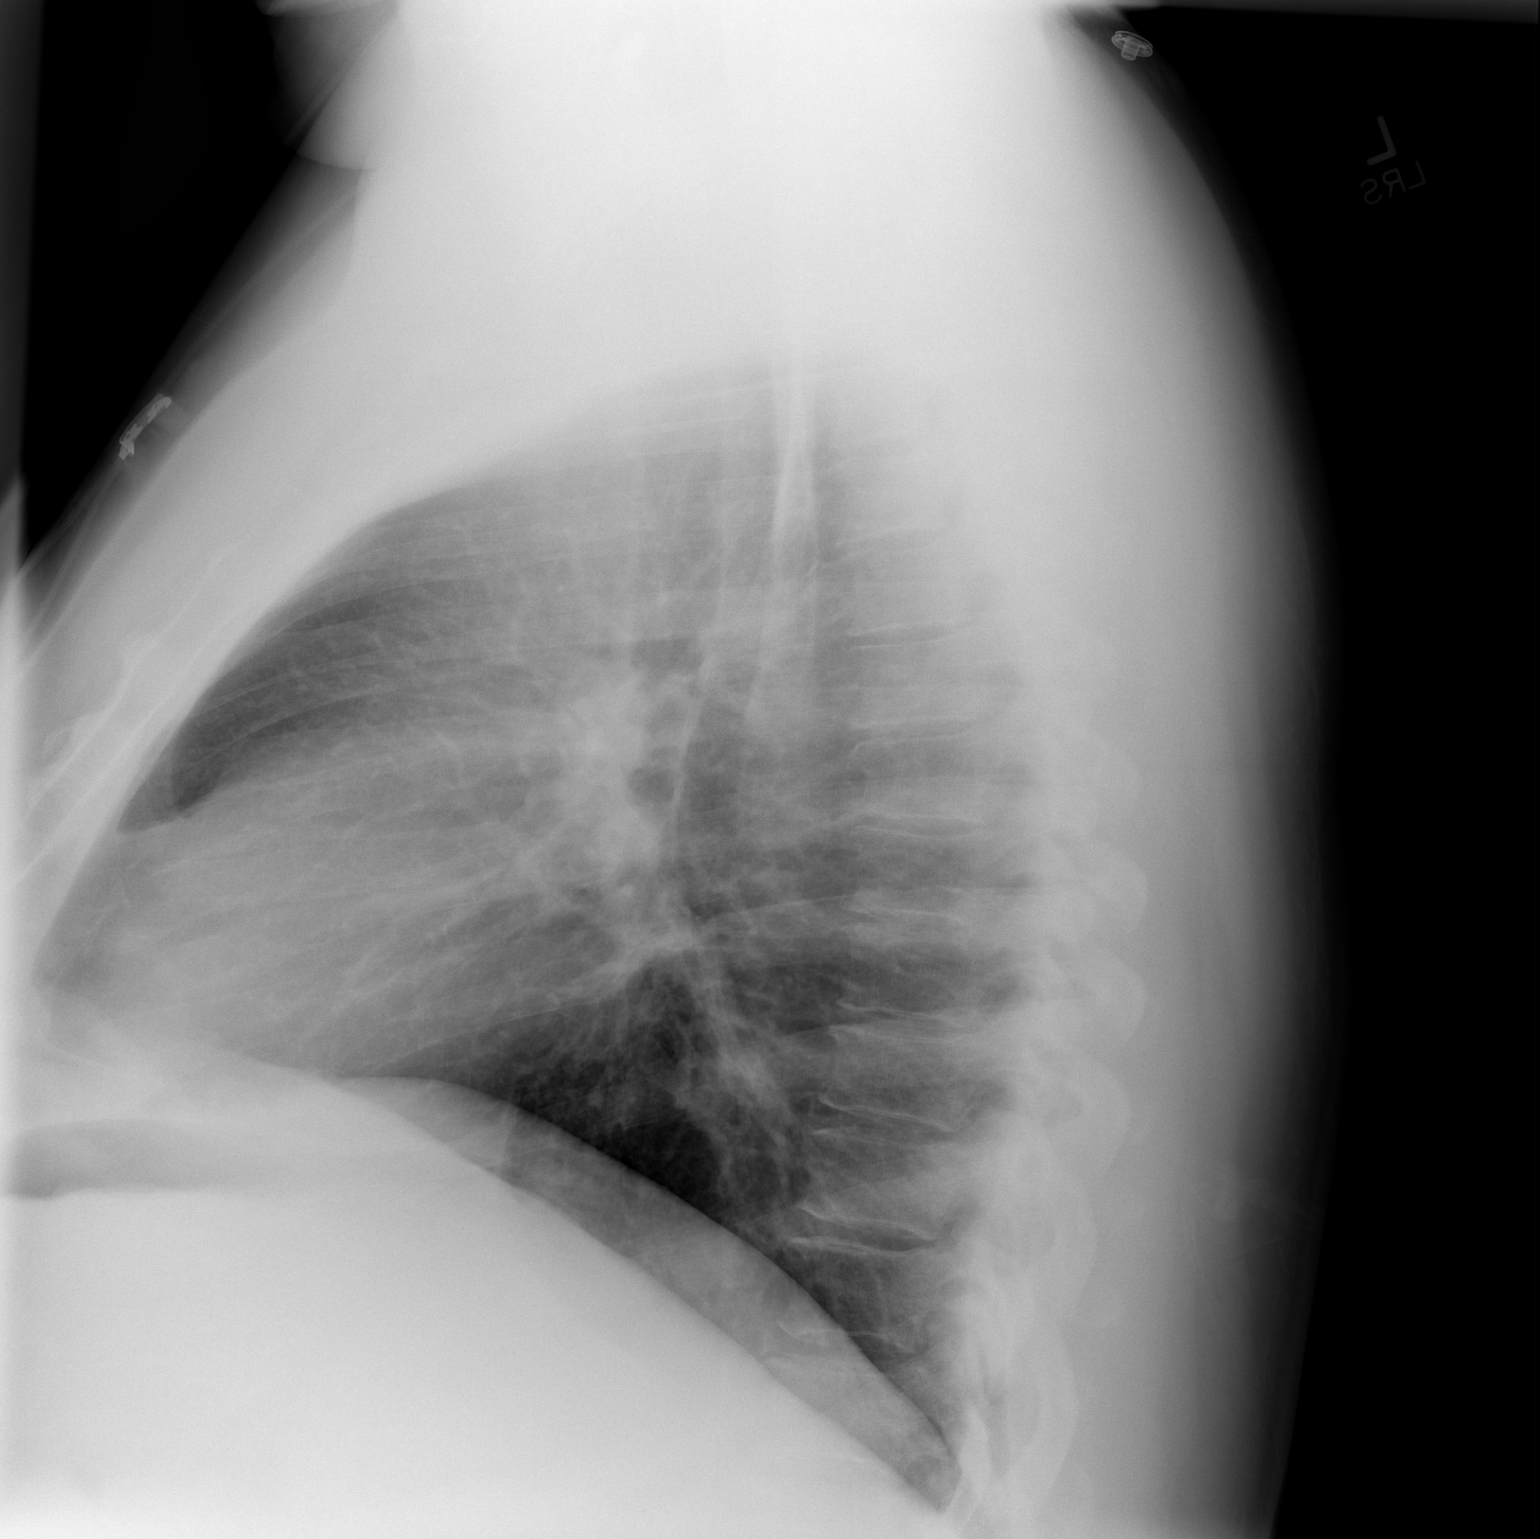

[2 of 2 positions shown; findings below may reference images not displayed]

FINDINGS: The cardiomediastinal silhouette is unremarkable.

Mild peribronchial thickening again noted.

There is no evidence of focal airspace disease, pulmonary edema,
suspicious pulmonary nodule/mass, pleural effusion, or pneumothorax.
No acute bony abnormalities are identified.

Remote left rib fractures are again noted.
IMPRESSION: No evidence of acute cardiopulmonary disease.

## 2015-03-03 IMAGING — US US SOFT TISSUE HEAD/NECK
1 series · 14 of 25 positions shown · non-contrast
Comparison: CT ANGIO CHEST AORTA W/CM & WO/CM dated 09/27/2013

CLINICAL DATA: Thyroid disease.

EXAM:
THYROID ULTRASOUND
TECHNIQUE: Ultrasound examination of the thyroid gland and adjacent soft
tissues was performed.

[Series 1: us soft tissue head/neck · 0.09mm/px · 14 of 62 slices shown]
[im 1/62]
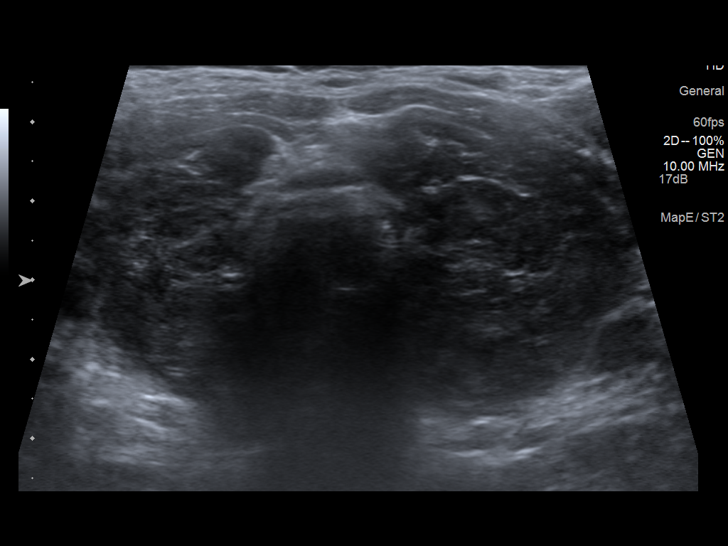
[im 6/62]
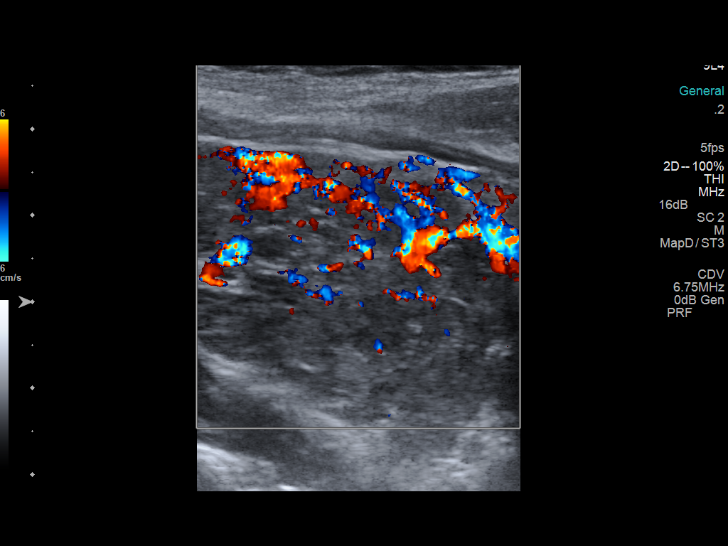
[im 11/62]
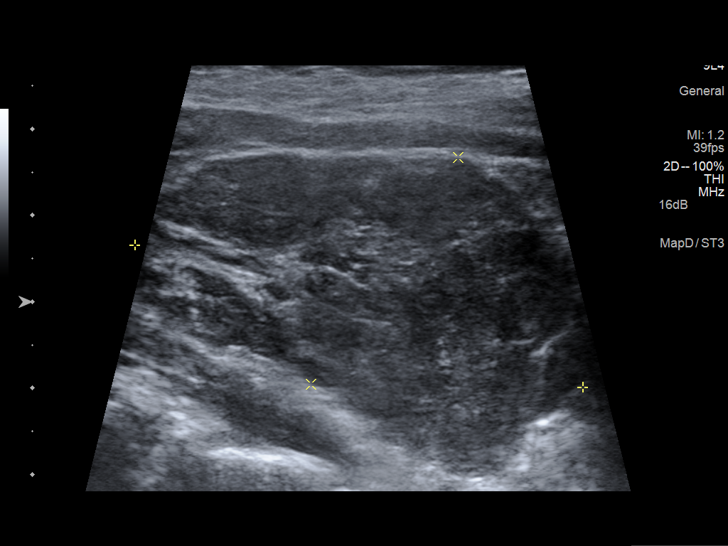
[im 16/62]
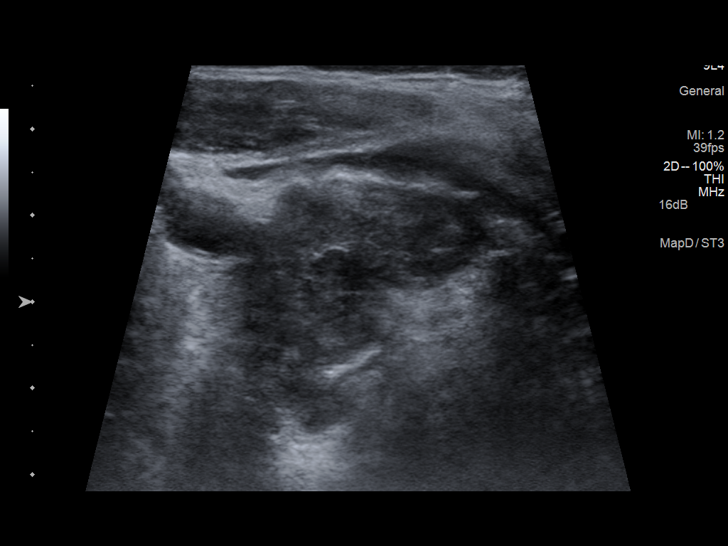
[im 21/62]
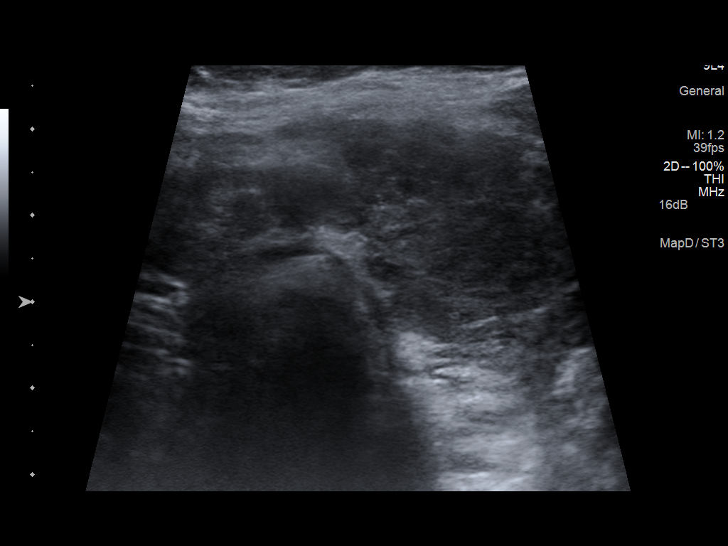
[im 23/62]
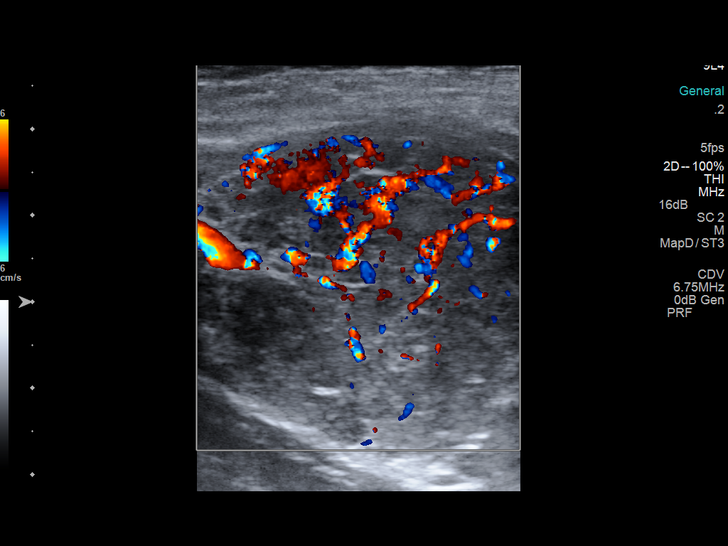
[im 28/62]
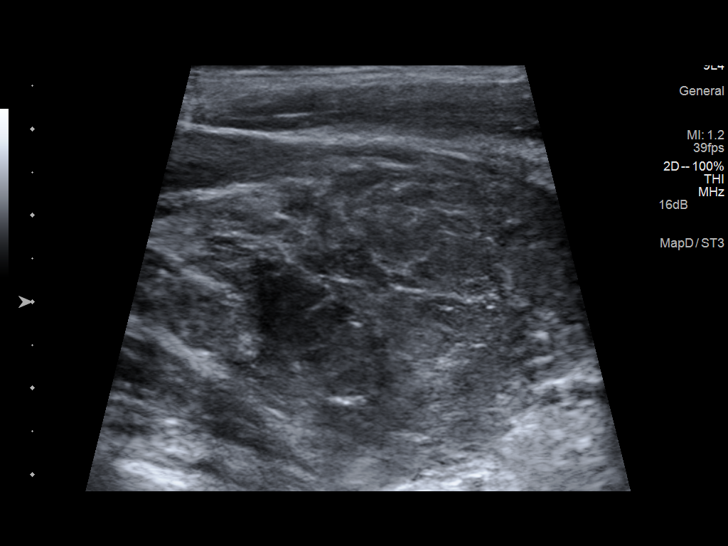
[im 34/62]
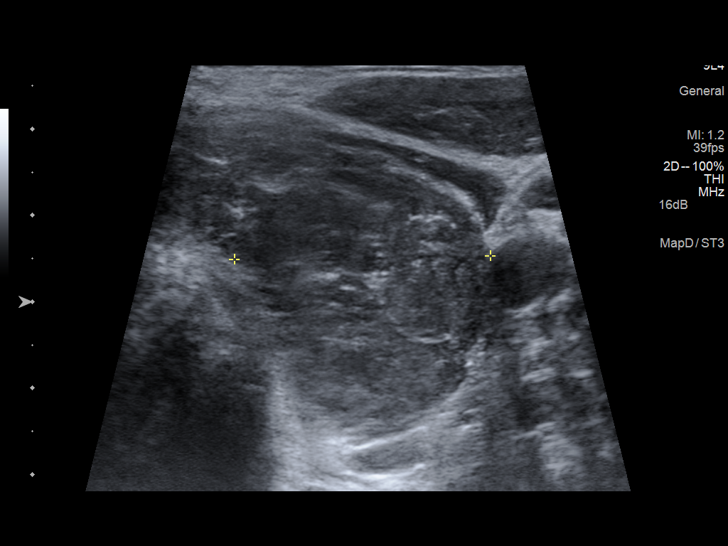
[im 39/62]
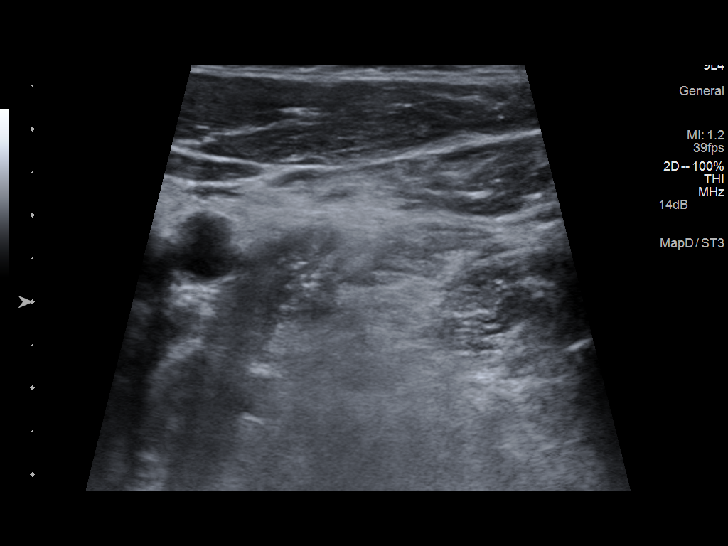
[im 41/62]
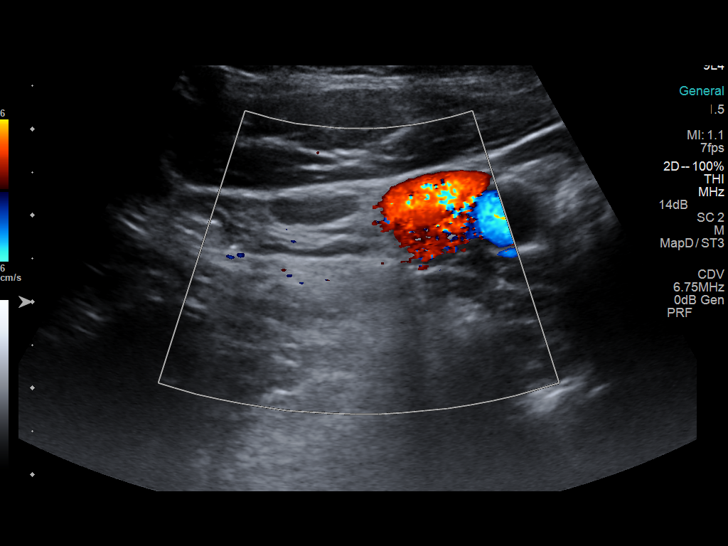
[im 46/62]
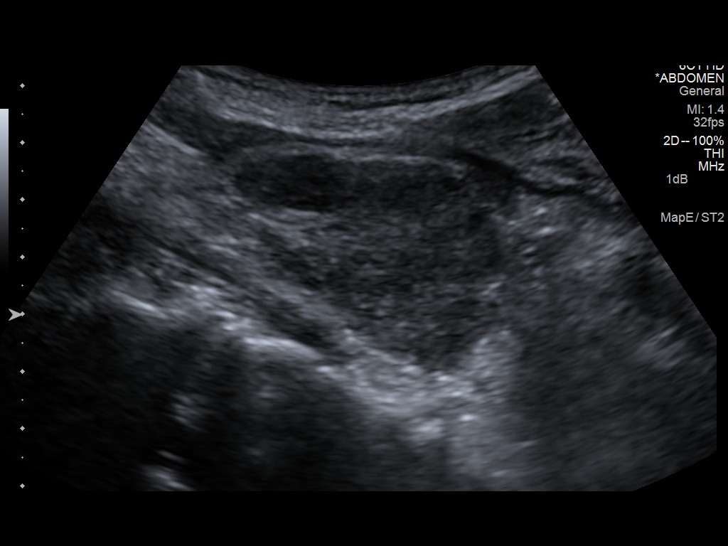
[im 51/62]
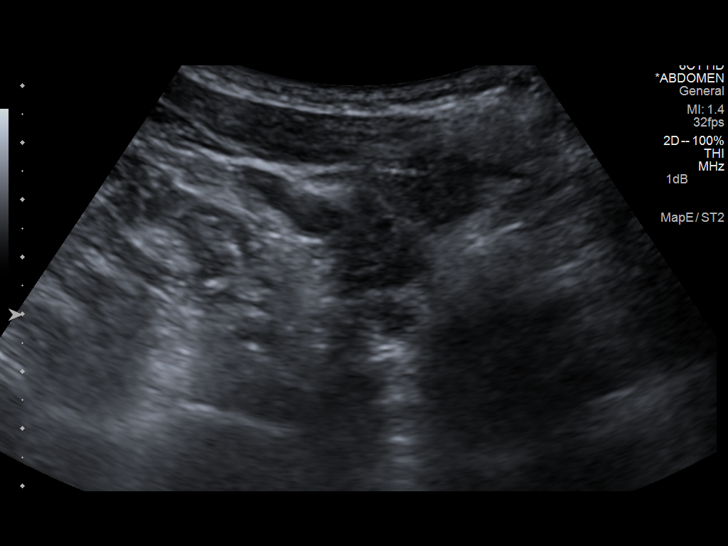
[im 56/62]
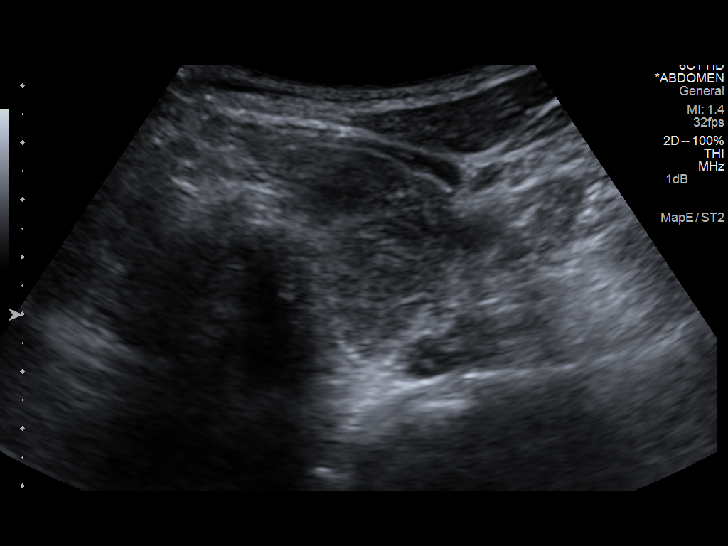
[im 62/62]
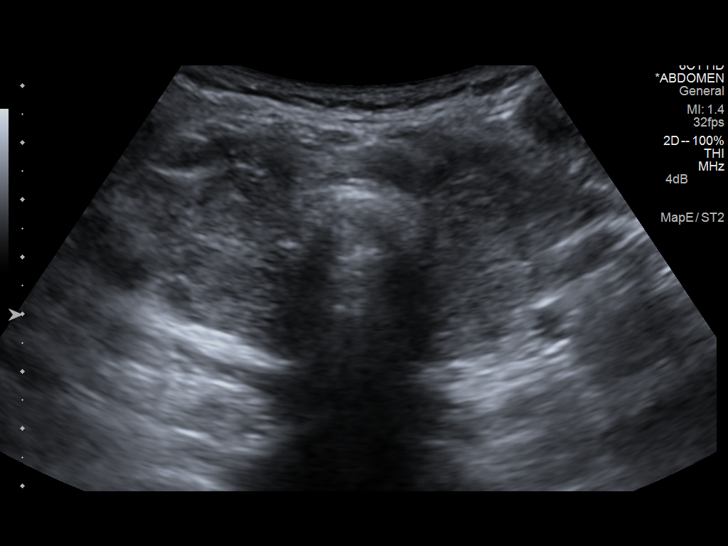

[14 of 25 positions shown; findings below may reference images not displayed]

FINDINGS: Right thyroid lobe

Measurements: 5.6 x 3.4 x 2.0 cm. No nodules visualized.
Heterogeneous echotexture.

Left thyroid lobe

Measurements: By 0.9 x 3.4 x 2.5 cm. No nodules visualized.
Heterogeneous echotexture.

Isthmus

Thickness: 0.8 cm..  No nodules visualized.

Lymph nodes

1.4 x 0.4 x 1.1 cm right cervical lymph node.
IMPRESSION: Thyroid gland is prominent with heterogeneous echotexture noted
diffusely. No focal mass lesion. These findings suggest thyroiditis.

## 2018-04-22 DIAGNOSIS — R0609 Other forms of dyspnea: Secondary | ICD-10-CM

## 2021-07-15 DIAGNOSIS — E039 Hypothyroidism, unspecified: Secondary | ICD-10-CM | POA: Diagnosis not present

## 2021-07-15 DIAGNOSIS — Z23 Encounter for immunization: Secondary | ICD-10-CM | POA: Diagnosis not present

## 2021-07-15 DIAGNOSIS — I10 Essential (primary) hypertension: Secondary | ICD-10-CM | POA: Diagnosis not present

## 2021-07-15 DIAGNOSIS — E1169 Type 2 diabetes mellitus with other specified complication: Secondary | ICD-10-CM | POA: Diagnosis not present

## 2021-07-15 DIAGNOSIS — E785 Hyperlipidemia, unspecified: Secondary | ICD-10-CM | POA: Diagnosis not present

## 2021-08-13 DIAGNOSIS — J029 Acute pharyngitis, unspecified: Secondary | ICD-10-CM | POA: Diagnosis not present

## 2021-08-13 DIAGNOSIS — J019 Acute sinusitis, unspecified: Secondary | ICD-10-CM | POA: Diagnosis not present

## 2021-08-13 DIAGNOSIS — R221 Localized swelling, mass and lump, neck: Secondary | ICD-10-CM | POA: Diagnosis not present

## 2021-08-19 DIAGNOSIS — E1169 Type 2 diabetes mellitus with other specified complication: Secondary | ICD-10-CM | POA: Diagnosis not present

## 2021-08-19 DIAGNOSIS — E785 Hyperlipidemia, unspecified: Secondary | ICD-10-CM | POA: Diagnosis not present

## 2021-08-19 DIAGNOSIS — R221 Localized swelling, mass and lump, neck: Secondary | ICD-10-CM | POA: Diagnosis not present

## 2021-08-26 DIAGNOSIS — R221 Localized swelling, mass and lump, neck: Secondary | ICD-10-CM | POA: Diagnosis not present

## 2021-09-04 DIAGNOSIS — D170 Benign lipomatous neoplasm of skin and subcutaneous tissue of head, face and neck: Secondary | ICD-10-CM | POA: Diagnosis not present

## 2021-11-25 DIAGNOSIS — M549 Dorsalgia, unspecified: Secondary | ICD-10-CM | POA: Diagnosis not present

## 2021-11-25 DIAGNOSIS — R0782 Intercostal pain: Secondary | ICD-10-CM | POA: Diagnosis not present

## 2021-11-28 DIAGNOSIS — D170 Benign lipomatous neoplasm of skin and subcutaneous tissue of head, face and neck: Secondary | ICD-10-CM | POA: Diagnosis not present

## 2021-12-23 DIAGNOSIS — E119 Type 2 diabetes mellitus without complications: Secondary | ICD-10-CM | POA: Diagnosis not present

## 2021-12-23 DIAGNOSIS — I10 Essential (primary) hypertension: Secondary | ICD-10-CM | POA: Diagnosis not present

## 2021-12-23 DIAGNOSIS — D170 Benign lipomatous neoplasm of skin and subcutaneous tissue of head, face and neck: Secondary | ICD-10-CM | POA: Diagnosis not present

## 2021-12-23 DIAGNOSIS — Z7984 Long term (current) use of oral hypoglycemic drugs: Secondary | ICD-10-CM | POA: Diagnosis not present

## 2021-12-23 DIAGNOSIS — E039 Hypothyroidism, unspecified: Secondary | ICD-10-CM | POA: Diagnosis not present

## 2021-12-23 DIAGNOSIS — F17210 Nicotine dependence, cigarettes, uncomplicated: Secondary | ICD-10-CM | POA: Diagnosis not present

## 2021-12-23 DIAGNOSIS — G4733 Obstructive sleep apnea (adult) (pediatric): Secondary | ICD-10-CM | POA: Diagnosis not present

## 2022-04-14 DIAGNOSIS — E119 Type 2 diabetes mellitus without complications: Secondary | ICD-10-CM | POA: Diagnosis not present

## 2022-04-14 DIAGNOSIS — R051 Acute cough: Secondary | ICD-10-CM | POA: Diagnosis not present

## 2022-04-14 DIAGNOSIS — J209 Acute bronchitis, unspecified: Secondary | ICD-10-CM | POA: Diagnosis not present

## 2022-04-17 DIAGNOSIS — E119 Type 2 diabetes mellitus without complications: Secondary | ICD-10-CM | POA: Diagnosis not present

## 2022-04-17 DIAGNOSIS — M549 Dorsalgia, unspecified: Secondary | ICD-10-CM | POA: Diagnosis not present

## 2022-04-17 DIAGNOSIS — N3090 Cystitis, unspecified without hematuria: Secondary | ICD-10-CM | POA: Diagnosis not present

## 2022-04-17 DIAGNOSIS — R3581 Nocturnal polyuria: Secondary | ICD-10-CM | POA: Diagnosis not present

## 2022-04-17 DIAGNOSIS — R739 Hyperglycemia, unspecified: Secondary | ICD-10-CM | POA: Diagnosis not present

## 2022-04-24 DIAGNOSIS — S39012A Strain of muscle, fascia and tendon of lower back, initial encounter: Secondary | ICD-10-CM | POA: Diagnosis not present

## 2022-04-24 DIAGNOSIS — N410 Acute prostatitis: Secondary | ICD-10-CM | POA: Diagnosis not present

## 2022-04-24 DIAGNOSIS — E1169 Type 2 diabetes mellitus with other specified complication: Secondary | ICD-10-CM | POA: Diagnosis not present

## 2022-04-24 DIAGNOSIS — E785 Hyperlipidemia, unspecified: Secondary | ICD-10-CM | POA: Diagnosis not present

## 2022-06-18 DIAGNOSIS — E1169 Type 2 diabetes mellitus with other specified complication: Secondary | ICD-10-CM | POA: Diagnosis not present

## 2022-06-18 DIAGNOSIS — N410 Acute prostatitis: Secondary | ICD-10-CM | POA: Diagnosis not present

## 2022-06-18 DIAGNOSIS — E785 Hyperlipidemia, unspecified: Secondary | ICD-10-CM | POA: Diagnosis not present

## 2022-06-18 DIAGNOSIS — Z125 Encounter for screening for malignant neoplasm of prostate: Secondary | ICD-10-CM | POA: Diagnosis not present

## 2022-06-18 DIAGNOSIS — E039 Hypothyroidism, unspecified: Secondary | ICD-10-CM | POA: Diagnosis not present

## 2022-08-21 DIAGNOSIS — R519 Headache, unspecified: Secondary | ICD-10-CM | POA: Diagnosis not present

## 2022-08-21 DIAGNOSIS — J0100 Acute maxillary sinusitis, unspecified: Secondary | ICD-10-CM | POA: Diagnosis not present

## 2022-10-07 DIAGNOSIS — I10 Essential (primary) hypertension: Secondary | ICD-10-CM | POA: Diagnosis not present

## 2022-10-07 DIAGNOSIS — E039 Hypothyroidism, unspecified: Secondary | ICD-10-CM | POA: Diagnosis not present

## 2022-10-07 DIAGNOSIS — E785 Hyperlipidemia, unspecified: Secondary | ICD-10-CM | POA: Diagnosis not present

## 2022-10-07 DIAGNOSIS — E1169 Type 2 diabetes mellitus with other specified complication: Secondary | ICD-10-CM | POA: Diagnosis not present

## 2022-10-07 DIAGNOSIS — F17210 Nicotine dependence, cigarettes, uncomplicated: Secondary | ICD-10-CM | POA: Diagnosis not present

## 2022-10-28 DIAGNOSIS — R42 Dizziness and giddiness: Secondary | ICD-10-CM | POA: Diagnosis not present

## 2022-10-28 DIAGNOSIS — I10 Essential (primary) hypertension: Secondary | ICD-10-CM | POA: Diagnosis not present

## 2022-10-28 DIAGNOSIS — R5383 Other fatigue: Secondary | ICD-10-CM | POA: Diagnosis not present

## 2022-10-28 DIAGNOSIS — R531 Weakness: Secondary | ICD-10-CM | POA: Diagnosis not present

## 2022-10-28 DIAGNOSIS — R079 Chest pain, unspecified: Secondary | ICD-10-CM | POA: Diagnosis not present

## 2022-10-28 DIAGNOSIS — R112 Nausea with vomiting, unspecified: Secondary | ICD-10-CM | POA: Diagnosis not present

## 2022-10-28 DIAGNOSIS — F17210 Nicotine dependence, cigarettes, uncomplicated: Secondary | ICD-10-CM | POA: Diagnosis not present

## 2022-10-28 DIAGNOSIS — Z79899 Other long term (current) drug therapy: Secondary | ICD-10-CM | POA: Diagnosis not present

## 2022-10-28 DIAGNOSIS — E039 Hypothyroidism, unspecified: Secondary | ICD-10-CM | POA: Diagnosis not present

## 2022-10-28 DIAGNOSIS — E785 Hyperlipidemia, unspecified: Secondary | ICD-10-CM | POA: Diagnosis not present

## 2022-10-28 DIAGNOSIS — Z7984 Long term (current) use of oral hypoglycemic drugs: Secondary | ICD-10-CM | POA: Diagnosis not present

## 2022-10-28 DIAGNOSIS — R55 Syncope and collapse: Secondary | ICD-10-CM | POA: Diagnosis not present

## 2022-10-28 DIAGNOSIS — R0789 Other chest pain: Secondary | ICD-10-CM | POA: Diagnosis not present

## 2022-10-28 DIAGNOSIS — E119 Type 2 diabetes mellitus without complications: Secondary | ICD-10-CM | POA: Diagnosis not present

## 2022-10-28 DIAGNOSIS — M549 Dorsalgia, unspecified: Secondary | ICD-10-CM | POA: Diagnosis not present

## 2022-10-29 DIAGNOSIS — I517 Cardiomegaly: Secondary | ICD-10-CM | POA: Diagnosis not present

## 2022-10-29 DIAGNOSIS — R112 Nausea with vomiting, unspecified: Secondary | ICD-10-CM | POA: Diagnosis not present

## 2022-10-29 DIAGNOSIS — I209 Angina pectoris, unspecified: Secondary | ICD-10-CM | POA: Diagnosis not present

## 2022-10-29 DIAGNOSIS — R42 Dizziness and giddiness: Secondary | ICD-10-CM | POA: Diagnosis not present

## 2022-10-29 DIAGNOSIS — R079 Chest pain, unspecified: Secondary | ICD-10-CM | POA: Diagnosis not present

## 2022-10-29 DIAGNOSIS — R0789 Other chest pain: Secondary | ICD-10-CM | POA: Diagnosis not present

## 2023-01-12 DIAGNOSIS — R051 Acute cough: Secondary | ICD-10-CM | POA: Diagnosis not present

## 2023-01-12 DIAGNOSIS — R0981 Nasal congestion: Secondary | ICD-10-CM | POA: Diagnosis not present

## 2023-01-12 DIAGNOSIS — R519 Headache, unspecified: Secondary | ICD-10-CM | POA: Diagnosis not present

## 2023-01-12 DIAGNOSIS — J324 Chronic pansinusitis: Secondary | ICD-10-CM | POA: Diagnosis not present

## 2023-08-10 DIAGNOSIS — R0981 Nasal congestion: Secondary | ICD-10-CM | POA: Diagnosis not present

## 2023-08-10 DIAGNOSIS — R051 Acute cough: Secondary | ICD-10-CM | POA: Diagnosis not present

## 2023-08-10 DIAGNOSIS — G43119 Migraine with aura, intractable, without status migrainosus: Secondary | ICD-10-CM | POA: Diagnosis not present

## 2023-08-10 DIAGNOSIS — J019 Acute sinusitis, unspecified: Secondary | ICD-10-CM | POA: Diagnosis not present

## 2023-09-09 DIAGNOSIS — E1169 Type 2 diabetes mellitus with other specified complication: Secondary | ICD-10-CM | POA: Diagnosis not present

## 2023-09-09 DIAGNOSIS — Z23 Encounter for immunization: Secondary | ICD-10-CM | POA: Diagnosis not present

## 2023-09-09 DIAGNOSIS — E039 Hypothyroidism, unspecified: Secondary | ICD-10-CM | POA: Diagnosis not present

## 2023-09-09 DIAGNOSIS — M7712 Lateral epicondylitis, left elbow: Secondary | ICD-10-CM | POA: Diagnosis not present

## 2023-09-09 DIAGNOSIS — E785 Hyperlipidemia, unspecified: Secondary | ICD-10-CM | POA: Diagnosis not present

## 2023-09-29 DIAGNOSIS — R059 Cough, unspecified: Secondary | ICD-10-CM | POA: Diagnosis not present

## 2023-09-29 DIAGNOSIS — R0981 Nasal congestion: Secondary | ICD-10-CM | POA: Diagnosis not present

## 2023-09-29 DIAGNOSIS — R1110 Vomiting, unspecified: Secondary | ICD-10-CM | POA: Diagnosis not present

## 2024-01-14 DIAGNOSIS — J019 Acute sinusitis, unspecified: Secondary | ICD-10-CM | POA: Diagnosis not present

## 2024-01-14 DIAGNOSIS — J209 Acute bronchitis, unspecified: Secondary | ICD-10-CM | POA: Diagnosis not present

## 2024-01-14 DIAGNOSIS — R0981 Nasal congestion: Secondary | ICD-10-CM | POA: Diagnosis not present

## 2024-01-14 DIAGNOSIS — R0781 Pleurodynia: Secondary | ICD-10-CM | POA: Diagnosis not present

## 2024-01-14 DIAGNOSIS — R051 Acute cough: Secondary | ICD-10-CM | POA: Diagnosis not present

## 2024-01-14 DIAGNOSIS — M791 Myalgia, unspecified site: Secondary | ICD-10-CM | POA: Diagnosis not present

## 2024-02-25 DIAGNOSIS — R42 Dizziness and giddiness: Secondary | ICD-10-CM | POA: Diagnosis not present

## 2024-02-25 DIAGNOSIS — R070 Pain in throat: Secondary | ICD-10-CM | POA: Diagnosis not present
